# Patient Record
Sex: Male | Born: 1935 | Race: White | Hispanic: No | Marital: Single | Smoking: Never smoker
Health system: Southern US, Community
[De-identification: ages and names within clinical notes are randomized; demographics above are authoritative.]

## PROBLEM LIST (undated history)

## (undated) DIAGNOSIS — I10 Essential (primary) hypertension: Secondary | ICD-10-CM

## (undated) DIAGNOSIS — E119 Type 2 diabetes mellitus without complications: Secondary | ICD-10-CM

## (undated) DIAGNOSIS — J441 Chronic obstructive pulmonary disease with (acute) exacerbation: Secondary | ICD-10-CM

## (undated) DIAGNOSIS — F1020 Alcohol dependence, uncomplicated: Secondary | ICD-10-CM

## (undated) DIAGNOSIS — K219 Gastro-esophageal reflux disease without esophagitis: Secondary | ICD-10-CM

## (undated) DIAGNOSIS — I208 Other forms of angina pectoris: Secondary | ICD-10-CM

## (undated) DIAGNOSIS — I5023 Acute on chronic systolic (congestive) heart failure: Secondary | ICD-10-CM

## (undated) DIAGNOSIS — I214 Non-ST elevation (NSTEMI) myocardial infarction: Secondary | ICD-10-CM

## (undated) DIAGNOSIS — I2510 Atherosclerotic heart disease of native coronary artery without angina pectoris: Secondary | ICD-10-CM

## (undated) DIAGNOSIS — J9600 Acute respiratory failure, unspecified whether with hypoxia or hypercapnia: Secondary | ICD-10-CM

## (undated) DIAGNOSIS — IMO0001 Reserved for inherently not codable concepts without codable children: Secondary | ICD-10-CM

## (undated) DIAGNOSIS — F411 Generalized anxiety disorder: Secondary | ICD-10-CM

## (undated) DIAGNOSIS — I509 Heart failure, unspecified: Secondary | ICD-10-CM

## (undated) HISTORY — DX: Type 2 diabetes mellitus without complications: E11.9

## (undated) HISTORY — PX: CORONARY STENT PLACEMENT: SHX1402

## (undated) HISTORY — DX: Acute on chronic systolic (congestive) heart failure: I50.23

## (undated) HISTORY — DX: Reserved for inherently not codable concepts without codable children: IMO0001

## (undated) HISTORY — DX: Atherosclerotic heart disease of native coronary artery without angina pectoris: I25.10

## (undated) HISTORY — DX: Chronic obstructive pulmonary disease with (acute) exacerbation: J44.1

## (undated) HISTORY — DX: Heart failure, unspecified: I50.9

## (undated) HISTORY — DX: Other forms of angina pectoris: I20.8

## (undated) HISTORY — PX: HEMORROIDECTOMY: SUR656

## (undated) HISTORY — DX: Gastro-esophageal reflux disease without esophagitis: K21.9

## (undated) HISTORY — DX: Generalized anxiety disorder: F41.1

## (undated) HISTORY — DX: Alcohol dependence, uncomplicated: F10.20

## (undated) HISTORY — DX: Non-ST elevation (NSTEMI) myocardial infarction: I21.4

## (undated) HISTORY — DX: Acute respiratory failure, unspecified whether with hypoxia or hypercapnia: J96.00

---

## 2010-01-27 ENCOUNTER — Ambulatory Visit: Payer: Self-pay | Admitting: Cardiology

## 2010-01-27 ENCOUNTER — Encounter: Payer: Self-pay | Admitting: Cardiology

## 2010-02-02 ENCOUNTER — Encounter: Payer: Self-pay | Admitting: Cardiology

## 2010-02-09 ENCOUNTER — Encounter: Payer: Self-pay | Admitting: Cardiology

## 2010-02-10 ENCOUNTER — Encounter: Payer: Self-pay | Admitting: Cardiology

## 2010-02-13 ENCOUNTER — Encounter: Payer: Self-pay | Admitting: Cardiology

## 2010-02-24 ENCOUNTER — Encounter: Payer: Self-pay | Admitting: Cardiology

## 2010-02-27 ENCOUNTER — Ambulatory Visit: Payer: Self-pay | Admitting: Cardiology

## 2010-02-27 DIAGNOSIS — E785 Hyperlipidemia, unspecified: Secondary | ICD-10-CM

## 2010-02-27 DIAGNOSIS — N189 Chronic kidney disease, unspecified: Secondary | ICD-10-CM | POA: Insufficient documentation

## 2010-02-27 DIAGNOSIS — E875 Hyperkalemia: Secondary | ICD-10-CM

## 2010-02-27 DIAGNOSIS — I252 Old myocardial infarction: Secondary | ICD-10-CM

## 2010-02-27 DIAGNOSIS — F411 Generalized anxiety disorder: Secondary | ICD-10-CM | POA: Insufficient documentation

## 2010-02-27 DIAGNOSIS — K449 Diaphragmatic hernia without obstruction or gangrene: Secondary | ICD-10-CM | POA: Insufficient documentation

## 2010-02-27 DIAGNOSIS — I5022 Chronic systolic (congestive) heart failure: Secondary | ICD-10-CM

## 2010-02-27 DIAGNOSIS — I129 Hypertensive chronic kidney disease with stage 1 through stage 4 chronic kidney disease, or unspecified chronic kidney disease: Secondary | ICD-10-CM

## 2010-02-27 DIAGNOSIS — I2510 Atherosclerotic heart disease of native coronary artery without angina pectoris: Secondary | ICD-10-CM

## 2010-02-27 DIAGNOSIS — K219 Gastro-esophageal reflux disease without esophagitis: Secondary | ICD-10-CM

## 2010-02-27 DIAGNOSIS — R0602 Shortness of breath: Secondary | ICD-10-CM

## 2010-02-27 DIAGNOSIS — N179 Acute kidney failure, unspecified: Secondary | ICD-10-CM

## 2010-07-28 ENCOUNTER — Ambulatory Visit: Payer: Self-pay | Admitting: Cardiology

## 2010-09-03 ENCOUNTER — Ambulatory Visit: Payer: Self-pay | Admitting: Physician Assistant

## 2010-09-03 DIAGNOSIS — I255 Ischemic cardiomyopathy: Secondary | ICD-10-CM

## 2010-10-14 ENCOUNTER — Encounter: Payer: Self-pay | Admitting: Physician Assistant

## 2010-10-16 ENCOUNTER — Encounter: Payer: Self-pay | Admitting: Cardiology

## 2010-10-27 ENCOUNTER — Telehealth (INDEPENDENT_AMBULATORY_CARE_PROVIDER_SITE_OTHER): Payer: Self-pay | Admitting: *Deleted

## 2010-12-28 ENCOUNTER — Ambulatory Visit
Admission: RE | Admit: 2010-12-28 | Discharge: 2010-12-28 | Payer: Self-pay | Source: Home / Self Care | Attending: Cardiology | Admitting: Cardiology

## 2010-12-28 ENCOUNTER — Encounter: Payer: Self-pay | Admitting: Cardiology

## 2010-12-28 DIAGNOSIS — I10 Essential (primary) hypertension: Secondary | ICD-10-CM | POA: Insufficient documentation

## 2011-01-12 NOTE — Letter (Signed)
Summary: [facility] D/C DR. HASANAJ  [facility] D/C DR. HASANAJ   Imported By: Zachary George 02/27/2010 10:21:37  _____________________________________________________________________  External Attachment:    Type:   Image     Comment:   External Document

## 2011-01-12 NOTE — Progress Notes (Signed)
Summary: chest pain     Phone Note Other Incoming   Caller: wife Summary of Call: c/o pt having chest pain.  Has taken 25 NTG in 10 days.  Questioned why he did not go to ED and wife states because he is a stubborn man.  States his pain is mostly after he goes outside and he breathes in cold air and usually gets better after he comes in and sits in front of the heater.  Advised her to contact PMD to be evaluated this week to rule out any lung/breathing issues.  If PMD feels this is a cardiac issue, he should contact our office to request earlier appt.  Advised her to call 911 or take to ED for evaluation if symptoms persist.  She verbalized understanding.    thanks, Gene Initial call taken by: Hoover Brunette, LPN,  October 27, 2010 2:38 PM

## 2011-01-12 NOTE — Assessment & Plan Note (Signed)
Summary: chest pain --agh   Visit Type:  Follow-up Primary Provider:  Dr. Olena Leatherwood  CC:  Cardiomyopathy.  History of Present Illness: The patient  presents for followup of his known coronary disease.  I reviewed his most recent catheterization from a few years ago. He has severe three-vessel disease but refused bypass. He is being managed medically and has had PCI. He has significant left main stenosis. He does get chest pain occasionally. He thinks this is related to pain he's been having with his knees. He is having a lot of trouble walking and with nighttime knee pain. He's been told that he can't do dissipate and physical therapy because of his cardiac problems. He has not been prescribed chronic pain therapy. He will occasionally get chewable breath at night and takes nitroglycerin for this we'll put a fan on himself. This is about one or 2 times per month. He denies any palpitations, presyncope or syncope. He ambulates with a walker or cane.  Preventive Screening-Counseling & Management  Alcohol-Tobacco     Smoking Status: never  Current Medications (verified): 1)  Alprazolam 0.5 Mg Tabs (Alprazolam) .... Take 1 Tablet By Mouth Two Times A Day 2)  Lipitor 80 Mg Tabs (Atorvastatin Calcium) .... Take 1 Tablet By Mouth Once A Day 3)  Ecotrin 325 Mg Tbec (Aspirin) .... Take 1 Tablet By Mouth Once A Day 4)  Plavix 75 Mg Tabs (Clopidogrel Bisulfate) .... Take 1 Tablet By Mouth Once A Day 5)  Isosorbide Mononitrate Cr 60 Mg Xr24h-Tab (Isosorbide Mononitrate) .... Take 1 Tablet By Mouth Once A Day (Place On File) 6)  Ranitidine Hcl 150 Mg Tabs (Ranitidine Hcl) .... Take 1 Tablet By Mouth Two Times A Day 7)  Furosemide 20 Mg Tabs (Furosemide) .... Take 1 Tablet By Mouth Once A Day 8)  Carvedilol 12.5 Mg Tabs (Carvedilol) .... Take 1 Tablet By Mouth Two Times A Day (Place On File) 9)  Nitrostat 0.4 Mg Subl (Nitroglycerin) .... Use As Directed Chest Pain Every Up To 3 Doses. If No  Relief,proceed To Ed.  Allergies (verified): 1)  ! Penicillin  Comments:  Nurse/Medical Assistant: The patient's medication bottles and allergies were reviewed with the patient and were updated in the Medication and Allergy Lists.  Past History:  Past Medical History: Reviewed history from 02/27/2010 and no changes required. ACUTE KIDNEY FAILURE UNSPECIFIED CORONARY ATHEROSCLEROSIS NATIVE CORONARY ARTERY HTN CKD UNS W/CKD STAGE I THRU STAGE IV/UNS  HYPERPOTASSEMIA) CHRONIC KIDNEY DISEASE UNSPECIFIED ANXIETY STATE, UNSPECIFIED GERD OLD MYOCARDIAL INFARCTION  HYPERLIPIDEMIA DIAPHRAGMAT HERN W/O MENTION OBSTRUCTION/GANGREN ENCOUNTER FOR LONG-TERM USE OF OTHER MEDICATIONS  HISTORY OF ALCOHOL ABUSE  Past Surgical History: Hemorrhoid surgery Cardiac stent placement  Review of Systems       As stated in the HPI and negative for all other systems.   Vital Signs:  Patient profile:   77 year old male Height:      62 inches Weight:      163 pounds O2 Sat:      98 % on Room air Pulse rate:   60 / minute BP sitting:   126 / 79  (left arm) Cuff size:   regular  Vitals Entered By: Carlye Grippe (July 28, 2010 9:30 AM)  O2 Flow:  Room air  Physical Exam  General:  unkept.  unkept.   Head:  normocephalic and atraumatic Mouth:  Edentulous. Oral mucosa normal. Neck:  Neck supple, no JVD. No masses, thyromegaly or abnormal cervical nodes. Chest Wall:  no deformities or breast masses noted Lungs:  Clear bilaterally to auscultation and percussion. Heart:  Non-displaced PMI, chest non-tender; regular rate and rhythm, S1, S2 without murmurs, rubs or gallops. Carotid upstroke normal, no bruit. Normal abdominal aortic size, no bruits. Femorals normal pulses, no bruits Abdomen:  Bowel sounds positive; abdomen soft and non-tender without masses, organomegaly, or hernias noted. No hepatosplenomegaly. Msk:  Back normal, normal gait. Muscle strength and tone normal. Pulses:   diminished R dorsalis pedis.  diminished R dorsalis pedis.   Extremities:  No clubbing or cyanosis. Neurologic:  Alert and oriented x 3. Skin:  Intact without lesions or rashes. Cervical Nodes:  no significant adenopathy Psych:  Normal affect.   EKG  Procedure date:  07/28/2010  Findings:      Sinus rhythm, rate 63, left axis deviation, right bundle branch block, old inferior infarct, lateral T wave inversions, no change from previous  Impression & Recommendations:  Problem # 1:  CORONARY ATHEROSCLEROSIS NATIVE CORONARY ARTERY (ICD-414.01)  He has severe coronary artery disease but wants to pursue medical management. Toward that end I will increase his isosorbide mononitrate to 120 mg daily. He's having some trouble swallowing this and we discussed strategies for this.  Problem # 2:  HYPERLIPIDEMIA (ICD-272.4)  He cannot swallow the Lipitor and so I will change to Crestor 40 mg daily which is a smaller pill. He should have his lipids checked in about 8 weeks.  His updated medication list for this problem includes:    Crestor 40 Mg Tabs (Rosuvastatin calcium) .Marland Kitchen... Take one tablet by mouth daily.  Problem # 3:  CHRONIC SYSTOLIC HEART FAILURE (ICD-428.22) He seems to be euvolemic. He can continue to have his meds titrated over time.  Problem # 4:  Chronic Knee Pain I will defer pain management to his primary physician. There is no cardiac contraindication to pain medications except that I would avoid nonsteroidals.  Other Orders: EKG w/ Interpretation (93000)  Patient Instructions: 1)  Increase Imdur (isosorbide) to 120mg  by mouth once daily. You may take 2 of your 60mg  tablets until gone. Then get new prescription filled for 120mg  tablets. 2)  Stop Lipitor. 3)  Start Crestor 40mg  by mouth at bedtime. 4)  Return on Thursday Sept 22, 2011 at 1pm for follow up with Gene Serpe, PA-C.  Prescriptions: CRESTOR 40 MG TABS (ROSUVASTATIN CALCIUM) Take one tablet by mouth daily.  #30 x  6   Entered by:   Cyril Loosen, RN, BSN   Authorized by:   Rollene Rotunda, MD, [facility]   Signed by:   Cyril Loosen, RN, BSN on 07/28/2010   Method used:   Electronically to        [facility] [address] (retail)       [address]       [place], [place]  [address]       Ph: [phone] or [phone]       Fax: [phone]   RxID:   [id number] ISOSORBIDE MONONITRATE CR 120 MG XR24H-TAB (ISOSORBIDE MONONITRATE) Take one tablet by mouth daily  #30 x 6   Entered by:   Cyril Loosen, RN, BSN   Authorized by:   Rollene Rotunda, MD, [facility]   Signed by:   Cyril Loosen, RN, BSN on 07/28/2010   Method used:   Electronically to        [facility] [address] (retail)       [address]       [place], [place]  [address]       Ph: [phone] or [phone]       Fax: [phone]   RxID:   [id number]  I have reviewed and approved all prescriptions at the time of this visit. Rollene Rotunda, MD, [facility]  July 28, 2010 10:15 AM

## 2011-01-12 NOTE — Letter (Signed)
Summary: [facility] D/C  DR. Lia Hopping  [facility] D/C  DR. XAJE HASANAJ   Imported By: Zachary George 02/27/2010 10:07:38  _____________________________________________________________________  External Attachment:    Type:   Image     Comment:   External Document

## 2011-01-12 NOTE — Consult Note (Signed)
Summary: [facility] INPT DR. [facility]ANAJ  [facility] INPT DR. HASANAJ   Imported By: Zachary George 02/27/2010 09:54:22  _____________________________________________________________________  External Attachment:    Type:   Image     Comment:   External Document

## 2011-01-12 NOTE — Miscellaneous (Signed)
Summary: rx- crestor  Clinical Lists Changes  Medications: Changed medication from CRESTOR 40 MG TABS (ROSUVASTATIN CALCIUM) Take one tablet by mouth daily. to CRESTOR 20 MG TABS (ROSUVASTATIN CALCIUM) Take 1 tab by mouth at bedtime - Signed Rx of CRESTOR 20 MG TABS (ROSUVASTATIN CALCIUM) Take 1 tab by mouth at bedtime;  #30 x 6;  Signed;  Entered by: Hoover Brunette, LPN;  Authorized by: Lewayne Bunting, MD, [facility];  Method used: Electronically to [facility] [address] [place], [place], [place]  [address], Ph: [phone] or [phone], Fax: [phone]    Prescriptions: CRESTOR 20 MG TABS (ROSUVASTATIN CALCIUM) Take 1 tab by mouth at bedtime  #30 x 6   Entered by:   Hoover Brunette, LPN   Authorized by:   Lewayne Bunting, MD, [facility]   Signed by:   Hoover Brunette, LPN on 41/32/4401   Method used:   Electronically to        [facility] [address] (retail)       [address]       [place], [place]  [address]       Ph: [phone] or [phone]       Fax: [phone]   RxID:   [id number]

## 2011-01-12 NOTE — Letter (Signed)
Summary: Internal Other/ PATIENT HISTORY FORM  Internal Other/ PATIENT HISTORY FORM   Imported By: Dorise Hiss 03/03/2010 11:39:47  _____________________________________________________________________  External Attachment:    Type:   Image     Comment:   External Document

## 2011-01-12 NOTE — Assessment & Plan Note (Signed)
Summary: 1 MO F/U PER 8/16 OV W/JH-GD PT-JM   Visit Type:  Follow-up Primary Provider:  Dr. Olena Leatherwood   History of Present Illness: patient presents for early scheduled office followup.  Medications were recently adjusted, per Dr. Antoine Poche, with up titration of isosorbide mononitrate, for more aggressive management of breakthrough angina. Lipitor was also substituted with Crestor, secondary to complaint of difficulty swallowing.  Patient presents today feeling much better, reporting much less angina, and having to use less sublingual nitroglycerin. His breathing is also improved, and he is more easily tolerating Crestor.  He also had recent steroid injection of his left knee, by Dr. Olena Leatherwood, resulting in complete resolution of his knee pain.  Preventive Screening-Counseling & Management  Alcohol-Tobacco     Smoking Status: never  Current Medications (verified): 1)  Alprazolam 0.5 Mg Tabs (Alprazolam) .... Take 1 Tablet By Mouth Two Times A Day 2)  Crestor 40 Mg Tabs (Rosuvastatin Calcium) .... Take One Tablet By Mouth Daily. 3)  Ecotrin 325 Mg Tbec (Aspirin) .... Take 1/2 Tab (162mg ) Daily 4)  Plavix 75 Mg Tabs (Clopidogrel Bisulfate) .... Take 1 Tablet By Mouth Once A Day 5)  Isosorbide Mononitrate Cr 120 Mg Xr24h-Tab (Isosorbide Mononitrate) .... Take One Tablet By Mouth Daily 6)  Ranitidine Hcl 150 Mg Tabs (Ranitidine Hcl) .... Take 1 Tablet By Mouth Two Times A Day 7)  Furosemide 20 Mg Tabs (Furosemide) .... Take 1 Tablet By Mouth Once A Day 8)  Carvedilol 12.5 Mg Tabs (Carvedilol) .... Take 1 Tablet By Mouth Two Times A Day (Place On File) 9)  Nitrostat 0.4 Mg Subl (Nitroglycerin) .... Use As Directed Chest Pain Every Up To 3 Doses. If No Relief,proceed To Ed.  Allergies: 1)  ! Penicillin  Past History:  Past Medical History: ACUTE KIDNEY FAILURE UNSPECIFIED CORONARY ATHEROSCLEROSIS NATIVE CORONARY ARTERY HTN CKD UNS W/CKD STAGE I THRU STAGE IV/UNS   HYPERPOTASSEMIA) CHRONIC KIDNEY DISEASE UNSPECIFIED ANXIETY STATE, UNSPECIFIED GERD OLD MYOCARDIAL INFARCTION  HYPERLIPIDEMIA DIAPHRAGMAT HERN W/O MENTION OBSTRUCTION/GANGREN ENCOUNTER FOR LONG-TERM USE OF OTHER MEDICATIONS  HISTORY OF ALCOHOL ABUSE Ischemic cardiomyopathy... EF 25-30% by 2-D echo, 2/11  Review of Systems       No fevers, chills, hemoptysis, dysphagia, melena, hematocheezia, hematuria, rash, claudication, orthopnea, pnd, pedal edema. All other systems negative.   Vital Signs:  Patient profile:   77 year old male Height:      62 inches Weight:      160 pounds Pulse rate:   50 / minute BP sitting:   123 / 71  (left arm) Cuff size:   regular  Vitals Entered By: Hoover Brunette, LPN (September 03, 2010 1:07 PM) Is Patient Diabetic? No Comments 1 month f/u   Physical Exam  Additional Exam:  GEN: 77 year old male, sitting upright, in no distress HEENT: NCAT,PERRLA,EOMI NECK: palpable pulses, no bruits; no JVD; no TM LUNGS: CTA bilaterally HEART: RRR (S1S2); no significant murmurs; no rubs; no gallops ABD: soft, NT; intact BS EXT: intact distal pulses; trace peripheral edema SKIN: warm, dry MUSC: no obvious deformity NEURO: A/O (x3)     Impression & Recommendations:  Problem # 1:  CORONARY ATHEROSCLEROSIS NATIVE CORONARY ARTERY (ICD-414.01)  patient's symptoms are much improved, following recent uptitration of isosorbide mononitrate. continue current medication regimen, and schedule return clinic visit with Dr. Antoine Poche in 3 months, for continued close monitoring and further medication adjustments. Of note, may consider a repeat 2-D echo at that time, for reassessment of severe LVD. Of note, he has  previously refused surgical intervention of his severe CAD, but may be amenable to undergoing ICD implantation, if indicated. of note, patient has been on Plavix for quite a while; will down titrate aspirin to 162 mg daily.  Problem # 2:  HYPERLIPIDEMIA  (ICD-272.4)  reassess lipid status with followup FLP/LFT profile in 8 weeks, following recent substitution with Crestor.  Problem # 3:  CHRONIC SYSTOLIC HEART FAILURE (ICD-428.22)  patient has lost 3 pounds since recent visit. Continue current diuretic dose.  Other Orders: Future Orders: T-Lipid Profile ([id number]) ... 10/13/2010 T-Hepatic Function [phone]) ... 10/13/2010  Patient Instructions: 1)  Decrease Aspirin to 162mg  daily 2)  Labs:  fasting lipid panel & liver function in 8 weeks 3)  Follow up in  3 months

## 2011-01-12 NOTE — Consult Note (Signed)
Summary: NEPHROLOGY CONSULT/ [facility]  NEPHROLOGY CONSULT/ [facility]   Imported By: Zachary George 02/27/2010 10:15:52  _____________________________________________________________________  External Attachment:    Type:   Image     Comment:   External Document

## 2011-01-12 NOTE — Assessment & Plan Note (Signed)
Summary: NEW PAT HOSP FU -D/C [facility] 2/21   Visit Type:  hospital f/u Primary Provider:  Hasanaj  CC:  hospital follow-up visit.  History of Present Illness: the patient is a 77 year old male is hospitalized with a non-ST elevation myocardial infarction.  The patient has a history of multivessel coronary artery disease including severe left main coronary artery disease.  He has an ejection fraction of 25 to 30%.  He has declined more recently noninvasive testing as well as intervention and coronary artery bypass grafting in the past.  He has renal insufficiency with a creatinine of around two.  He was recently hospitalized because of acute pulmonary edema with ventilator-dependent respiratory failure in the setting of a non-ST elevation myocardial infarction.  The patient continues to complain of shortness of breath on minimal exertion.  He denies however any recent chest pain.  He reports a cough of productive white phlegm.  He complains of a dry mouth.  He denies any palpitations or syncope.  The patient continues to decline any further testing or intervention.  Clinical Review Panels:  CXR CXR results  PORTABLE CHEST - 1 VIEW                             Comparison: 01/28/2010                   Findings: Interval extubation.  No focal opacities in the lungs.         No effusions.  Heart is borderline in size.                   IMPRESSION:         Interval extubation.  No acute cardiopulmonary disease.           (02/09/2010)  Echocardiogram Echocardiogram Transthoracic Echocardiogram            Conclusions:         1. The estimated ejection fraction is 25-30%.          2. There is severely decreased left ventricular systolic function.         3. There are multiple regional wall motion abnormalities.          4. The left atrial chamber size is normal.         5. The right ventricular cavity size is normal.         6. There is no evidence of aortic regurgitation.         7. There is  no evidence of mitral regurgitation.         8. There is no evidence of tricuspid valve regurgitation.         9. The pericardium appears normal.         10. The inferior vena cava appears normal.         GUY NMN DEGENT, MD      (01/27/2010)    Preventive Screening-Counseling & Management  Alcohol-Tobacco     Smoking Status: never  Current Medications (verified): 1)  Alprazolam 0.5 Mg Tabs (Alprazolam) .... Take 1 Tablet By Mouth Two Times A Day 2)  Lipitor 80 Mg Tabs (Atorvastatin Calcium) .... Take 1 Tablet By Mouth Once A Day 3)  Ecotrin 325 Mg Tbec (Aspirin) .... Take 1 Tablet By Mouth Once A Day 4)  Plavix 75 Mg Tabs (Clopidogrel Bisulfate) .... Take 1 Tablet By Mouth Once A Day 5)  Isosorbide Mononitrate  Cr 60 Mg Xr24h-Tab (Isosorbide Mononitrate) .... Take 1 Tablet By Mouth Once A Day (Place On File) 6)  Ranitidine Hcl 150 Mg Tabs (Ranitidine Hcl) .... Take 1 Tablet By Mouth Two Times A Day 7)  Furosemide 20 Mg Tabs (Furosemide) .... Take 1 Tablet By Mouth Once A Day 8)  Carvedilol 12.5 Mg Tabs (Carvedilol) .... Take 1 Tablet By Mouth Two Times A Day (Place On File)  Allergies (verified): 1)  ! Penicillin  Comments:  Nurse/Medical Assistant: The patient's medications and allergies were reviewed with the patient and were updated in the Medication and Allergy Lists. List and bottles reviewed.  Past History:  Past Medical History: Last updated: 02/27/2010 ACUTE KIDNEY FAILURE UNSPECIFIED CORONARY ATHEROSCLEROSIS NATIVE CORONARY ARTERY HTN CKD UNS W/CKD STAGE I THRU STAGE IV/UNS  HYPERPOTASSEMIA) CHRONIC KIDNEY DISEASE UNSPECIFIED ANXIETY STATE, UNSPECIFIED GERD OLD MYOCARDIAL INFARCTION  HYPERLIPIDEMIA DIAPHRAGMAT HERN W/O MENTION OBSTRUCTION/GANGREN ENCOUNTER FOR LONG-TERM USE OF OTHER MEDICATIONS  HISTORY OF ALCOHOL ABUSE  Past Surgical History: Last updated: 02/27/2010 Hemorrhoid surgery cardiac stent placement  Social History: Last updated:  02/27/2010 Alcohol Use - no Regular Exercise - no History of smoking  Risk Factors: Smoking Status: never (02/27/2010)  Social History: Smoking Status:  never  Review of Systems       The patient complains of shortness of breath.  The patient denies fatigue, malaise, fever, weight gain/loss, vision loss, decreased hearing, hoarseness, chest pain, palpitations, prolonged cough, wheezing, sleep apnea, coughing up blood, abdominal pain, blood in stool, nausea, vomiting, diarrhea, heartburn, incontinence, blood in urine, muscle weakness, joint pain, leg swelling, rash, skin lesions, headache, fainting, dizziness, depression, anxiety, enlarged lymph nodes, easy bruising or bleeding, and environmental allergies.         dry mouth,  Vital Signs:  Patient profile:   77 year old male Height:      62 inches Weight:      150 pounds BMI:     27.53 Pulse rate:   91 / minute BP sitting:   119 / 85  (left arm) Cuff size:   regular  Vitals Entered By: Carlye Grippe (February 27, 2010 11:06 AM)  Nutrition Counseling: Patient's BMI is greater than 25 and therefore counseled on weight management options. CC: hospital follow-up visit   Physical Exam  Additional Exam:  General: Well-developed, well-nourished in no distress head: Normocephalic and atraumatic eyes PERRLA/EOMI intact, conjunctiva and lids normal nose: No deformity or lesions mouth normal dentition, normal posterior pharynx neck: Supple, no JVD.  No masses, thyromegaly or abnormal cervical nodes lungs: Normal breath sounds bilaterally without wheezing.  Normal percussion heart: regular rate and rhythm with normal S1 and S2, no S3 or S4.  PMI is normal.  No pathological murmurs abdomen: Normal bowel sounds, abdomen is soft and nontender without masses, organomegaly or hernias noted.  No hepatosplenomegaly musculoskeletal: Back normal, normal gait muscle strength and tone normal pulsus: Pulse is normal in all 4  extremities Extremities: No peripheral pitting edema neurologic: Alert and oriented x 3 skin: Intact without lesions or rashes cervical nodes: No significant adenopathy psychologic: Normal affect    Impression & Recommendations:  Problem # 1:  CHRONIC SYSTOLIC HEART FAILURE (ICD-428.22) the patient has a severe ischemic cardiomyopathy.  He status post recentpulmonary edema.  Is also status-post recent non-ST elevation myocardial infarction.  He continues to decline any further intervention.  We increased the patient's carvedilol to 12.5 mg p.o. b.i.d. and increase Imdur to 60 mg p.o. daily. His updated medication list for  this problem includes:    Ecotrin 325 Mg Tbec (Aspirin) .Marland Kitchen... Take 1 tablet by mouth once a day    Plavix 75 Mg Tabs (Clopidogrel bisulfate) .Marland Kitchen... Take 1 tablet by mouth once a day    Isosorbide Mononitrate Cr 60 Mg Xr24h-tab (Isosorbide mononitrate) .Marland Kitchen... Take 1 tablet by mouth once a day (place on file)    Furosemide 20 Mg Tabs (Furosemide) .Marland Kitchen... Take 1 tablet by mouth once a day    Carvedilol 12.5 Mg Tabs (Carvedilol) .Marland Kitchen... Take 1 tablet by mouth two times a day (place on file)  Orders: T-CBC No Diff ([id number]) T-Comprehensive Metabolic Panel ([id number]) T-BNP  (B Natriuretic Peptide) (40347-42595) T-Lipid Profile ([id number])  Problem # 2:  ACUTE KIDNEY FAILURE UNSPECIFIED (ICD-584.9) the patient is followed by Dr. Kristian Covey  Problem # 3:  SHORTNESS OF BREATH (ICD-786.05) the patient's shortness of breath is related to his underlying severe ischemic cardiac myopathy.  However blood work was drawn and we will check a BNP level.  I made no change in the patient's Lasix dosing for now His updated medication list for this problem includes:    Ecotrin 325 Mg Tbec (Aspirin) .Marland Kitchen... Take 1 tablet by mouth once a day    Furosemide 20 Mg Tabs (Furosemide) .Marland Kitchen... Take 1 tablet by mouth once a day    Carvedilol 12.5 Mg Tabs (Carvedilol) .Marland Kitchen... Take 1 tablet by mouth two  times a day (place on file)  Orders: T-CBC No Diff ([id number]) T-Comprehensive Metabolic Panel ([id number]) T-BNP  (B Natriuretic Peptide) (23557-32202) T-Lipid Profile (54270-62376)  Patient Instructions: 1)  Labs today 2)  Increase Carvedilol to 12.5mg  two times a day  3)  Increase Imdur to 60mg  daily 4)  Follow up in  6 months Prescriptions: ISOSORBIDE MONONITRATE CR 60 MG XR24H-TAB (ISOSORBIDE MONONITRATE) Take 1 tablet by mouth once a day (PLACE ON FILE)  #30 x 6   Entered by:   Hoover Brunette, LPN   Authorized by:   Lewayne Bunting, MD, [facility]   Signed by:   Hoover Brunette, LPN on 28/31/5176   Method used:   Electronically to        [facility] [address] (retail)       [address]       [place], [place]  [address]       Ph: [phone] or [phone]       Fax: [phone]   RxID:   [id number] CARVEDILOL 12.5 MG TABS (CARVEDILOL) Take 1 tablet by mouth two times a day (PLACE ON FILE)  #60 x 6   Entered by:   Hoover Brunette, LPN   Authorized by:   Lewayne Bunting, MD, [facility]   Signed by:   Hoover Brunette, LPN on 58/52/7782   Method used:   Electronically to        [facility] [address] (retail)       [address]       [place], [place]  [address]       Ph: [phone] or [phone]       Fax: [phone]   RxID:   [id number]

## 2011-01-14 NOTE — Assessment & Plan Note (Signed)
Summary: 6 MO FU PER DEC REMINDER   Visit Type:  Follow-up Primary Provider:  Dr. Olena Leatherwood  CC:  CAD.  History of Present Illness: The patient presents for routine followup. Since I last saw him he has had no new cardiovascular complaints. He will get chest pain he goes out into the cold air and he will take a nitroglycerin. However, he thinks this is much improved compared to previous. He is taking many fewer nitroglycerin that he used to take. He denies any acute shortness of breath, PND or orthopnea. He is not having any palpitations, presyncope or syncope. He said no weight gain or edema.  Preventive Screening-Counseling & Management  Alcohol-Tobacco     Smoking Status: never  Current Medications (verified): 1)  Alprazolam 0.5 Mg Tabs (Alprazolam) .... Take 1 Tablet By Mouth Two Times A Day 2)  Crestor 20 Mg Tabs (Rosuvastatin Calcium) .... Take 1 Tab By Mouth At Bedtime 3)  Ecotrin 325 Mg Tbec (Aspirin) .... Take 1/2 Tab (162mg ) Daily 4)  Plavix 75 Mg Tabs (Clopidogrel Bisulfate) .... Take 1 Tablet By Mouth Once A Day 5)  Isosorbide Mononitrate Cr 120 Mg Xr24h-Tab (Isosorbide Mononitrate) .... Take One Tablet By Mouth Daily 6)  Ranitidine Hcl 150 Mg Tabs (Ranitidine Hcl) .... Take 1 Tablet By Mouth Two Times A Day 7)  Furosemide 20 Mg Tabs (Furosemide) .... Take 1 Tablet By Mouth Once A Day 8)  Carvedilol 12.5 Mg Tabs (Carvedilol) .... Take 1 Tablet By Mouth Two Times A Day (Place On File) 9)  Nitrostat 0.4 Mg Subl (Nitroglycerin) .... Use As Directed Chest Pain Every Up To 3 Doses. If No Relief,proceed To Ed.  Allergies (verified): 1)  ! Penicillin  Comments:  Nurse/Medical Assistant: The patient's medications and allergies were reviewed with the patient and were updated in the Medication and Allergy Lists. PT COULD NOT VERIFY LIST.  WILL BRING A LIST TOMORROW 12/29/10 Tammi Romine CMA (December 28, 2010 3:07 PM)  Past History:  Past Medical History: Reviewed history  from 09/03/2010 and no changes required. ACUTE KIDNEY FAILURE UNSPECIFIED CORONARY ATHEROSCLEROSIS NATIVE CORONARY ARTERY HTN CKD UNS W/CKD STAGE I THRU STAGE IV/UNS  HYPERPOTASSEMIA) CHRONIC KIDNEY DISEASE UNSPECIFIED ANXIETY STATE, UNSPECIFIED GERD OLD MYOCARDIAL INFARCTION  HYPERLIPIDEMIA DIAPHRAGMAT HERN W/O MENTION OBSTRUCTION/GANGREN ENCOUNTER FOR LONG-TERM USE OF OTHER MEDICATIONS  HISTORY OF ALCOHOL ABUSE Ischemic cardiomyopathy... EF 25-30% by 2-D echo, 2/11  Past Surgical History: Reviewed history from 07/28/2010 and no changes required. Hemorrhoid surgery Cardiac stent placement  Review of Systems       As stated in the HPI and negative for all other systems.   Vital Signs:  Patient profile:   77 year old male Height:      62 inches Weight:      168 pounds BMI:     30.84 Pulse rate:   60 / minute BP sitting:   151 / 75  (left arm) Cuff size:   regular  Vitals Entered By: Fuller Plan CMA (December 28, 2010 3:08 PM)  Physical Exam  General:  Well developed, well nourished, in no acute distress. Head:  normocephalic and atraumatic Neck:  Neck supple, no JVD. No masses, thyromegaly or abnormal cervical nodes. Chest Wall:  no deformities or breast masses noted Lungs:  Clear bilaterally to auscultation and percussion. Abdomen:  Bowel sounds positive; abdomen soft and non-tender without masses, organomegaly, or hernias noted. No hepatosplenomegaly. Msk:  Back normal, normal gait. Muscle strength and tone normal. Extremities:  No clubbing  or cyanosis. Neurologic:  Alert and oriented x 3. Skin:  Intact without lesions or rashes. Cervical Nodes:  no significant adenopathy Inguinal Nodes:  no significant adenopathy Psych:  Normal affect.   Detailed Cardiovascular Exam  Neck    Carotids: Carotids full and equal bilaterally without bruits.      Neck Veins: Normal, no JVD.    Heart    Inspection: no deformities or lifts noted.      Palpation: normal PMI with  no thrills palpable.      Auscultation: regular rate and rhythm, S1, S2 without murmurs, rubs, gallops, or clicks.    Vascular    Abdominal Aorta: no palpable masses, pulsations, or audible bruits.      Femoral Pulses: normal femoral pulses bilaterally.      Radial Pulses: normal radial pulses bilaterally.      Peripheral Circulation: no clubbing, cyanosis, or edema noted with normal capillary refill.     EKG  Procedure date:  12/28/2010  Findings:      sinus bradycardia, rate 59, premature ventricular contractions, old inferior infarct, right bundle branch block, probable lateral infarct, no acute ST-T wave changes  Impression & Recommendations:  Problem # 1:  CORONARY ATHEROSCLEROSIS NATIVE CORONARY ARTERY (ICD-414.01) The patient has severe coronary disease. However, he has steadfastly refused bypass. PCI was not an option at the last catheterization. He understands his risk of sudden death he says he is "okay with this". He says he is almost 77 years old and wants medical management. This means he would not want an ICD as well. He is quite pleased with the improvement he has experienced with medical management. We will continue with this.  Problem # 2:  CHRONIC SYSTOLIC HEART FAILURE (ICD-428.22) He seems to be euvolemic on the meds as listed. I cannot titrate his beta blocker further secondary to bradycardia. He will remain on meds as listed.  Problem # 3:  HYPERLIPIDEMIA (ICD-272.4) I will defer to his primary physician with a goal LDL less than 70 and HDL greater than 40.  Problem # 4:  ESSENTIAL HYPERTENSION, BENIGN (ICD-401.1) His blood pressure is slightly elevated today and previous readings and this is unusual. I will change his regimen.  Other Orders: EKG w/ Interpretation (93000)  Patient Instructions: 1)  Your physician wants you to follow-up in: 6 months. You will receive a reminder letter in the mail one-two months in advance. If you don't receive a letter, please  call our office to schedule the follow-up appointment. 2)  CALL OR BRING MEDICATIONS BY THE OFFICE.

## 2011-07-20 DIAGNOSIS — R7989 Other specified abnormal findings of blood chemistry: Secondary | ICD-10-CM

## 2011-07-22 DIAGNOSIS — I214 Non-ST elevation (NSTEMI) myocardial infarction: Secondary | ICD-10-CM

## 2011-07-23 DIAGNOSIS — I5021 Acute systolic (congestive) heart failure: Secondary | ICD-10-CM

## 2011-07-23 DIAGNOSIS — R079 Chest pain, unspecified: Secondary | ICD-10-CM

## 2011-08-11 ENCOUNTER — Encounter: Payer: Self-pay | Admitting: Cardiology

## 2011-08-12 ENCOUNTER — Encounter: Payer: Self-pay | Admitting: Cardiology

## 2011-08-12 ENCOUNTER — Ambulatory Visit (INDEPENDENT_AMBULATORY_CARE_PROVIDER_SITE_OTHER): Payer: Medicare Other | Admitting: Physician Assistant

## 2011-08-12 VITALS — BP 96/61 | HR 51 | Ht 62.0 in | Wt 147.0 lb

## 2011-08-12 DIAGNOSIS — I5022 Chronic systolic (congestive) heart failure: Secondary | ICD-10-CM

## 2011-08-12 DIAGNOSIS — E119 Type 2 diabetes mellitus without complications: Secondary | ICD-10-CM | POA: Insufficient documentation

## 2011-08-12 DIAGNOSIS — I498 Other specified cardiac arrhythmias: Secondary | ICD-10-CM

## 2011-08-12 DIAGNOSIS — I959 Hypotension, unspecified: Secondary | ICD-10-CM | POA: Insufficient documentation

## 2011-08-12 DIAGNOSIS — I2589 Other forms of chronic ischemic heart disease: Secondary | ICD-10-CM

## 2011-08-12 DIAGNOSIS — R001 Bradycardia, unspecified: Secondary | ICD-10-CM

## 2011-08-12 DIAGNOSIS — E785 Hyperlipidemia, unspecified: Secondary | ICD-10-CM

## 2011-08-12 MED ORDER — CARVEDILOL 3.125 MG PO TABS: 3.1250 mg | ORAL_TABLET | Freq: Two times a day (BID) | ORAL | Status: DC

## 2011-08-12 NOTE — Assessment & Plan Note (Signed)
Patient is euvolemic by history and PE. He has lost 7 additional pounds, since discharge from [facility]. Therefore, recommend continuing current maintenance dose Lasix. Recent f/u labs notable for BUN 27, creatinine 1.3, and potassium 4.5. Advise 1500 cc fluid restriction/24 hours.

## 2011-08-12 NOTE — Assessment & Plan Note (Signed)
Patient is doing much better, since recent hospitalization with NST EMI and A/C. SHF. He continues to have symptoms suggestive of stable angina, requiring occasional p.r.n. NTG, with relief. Of note, he denies any rest angina. He continues to decline aggressive treatment, including ICD implantation. Therefore, continued conservative management is recommended, with close clinic followup with Dr. Andee Lineman.

## 2011-08-12 NOTE — Progress Notes (Signed)
HPI: patient presents for post [facility] followuptrauma following presentation with NSTEMI and A/C SHF.  As in the past, patient declined aggressive management. He was treated initially with IV heparin, and had a troponin of 1.8. A 2-D echo was obtained, yielding EF 25-30%. He was discharged on increased dose of maintenance Lasix at 40 mg daily, with concomitant decrease in ACE inhibitor, secondary to relative hypotension. Dr. Andee Lineman also added low-dose aspirin to his Plavix, and restarted low-dose carvedilol. He also recommended high dose isosorbide mononitrate. Recent post hospital labs indicated stable CRI, with creatinine 1.3, BUN 27, and potassium 4.1. He had a BNP of 1100 on admission, down to 190 at discharge.  Clinically, he feels much better. He has occasional exertional angina, requiring p.r.n. NTG, with subsequent relief, but denies any rest angina. He is able to sleep on 2 pillows, and has no peripheral edema. He is compliant with medications, and refrains from dietary sodium indiscretion.  The patient has also experienced occasional postural dizziness, upon standing. However, he denies any frank syncope or falls.  Allergies  Allergen Reactions  . Penicillins     No current outpatient prescriptions on file prior to visit.    Past Medical History  Diagnosis Date  . Congestive heart failure, unspecified   . Acute on chronic systolic heart failure   . Acute respiratory failure   . Acute myocardial infarction, subendocardial infarction, initial episode of care   . Obstructive chronic bronchitis with exacerbation   . Other chronic pulmonary heart diseases   . Congestive heart failure, unspecified   . Coronary atherosclerosis of native coronary artery   . Anxiety state, unspecified   . Type II or unspecified type diabetes mellitus without mention of complication, not stated as uncontrolled   . Other specified forms of chronic ischemic heart disease   . Alcoholism /alcohol abuse   . GERD  (gastroesophageal reflux disease)     Past Surgical History  Procedure Date  . Hemorroidectomy   . Coronary stent placement     History   Social History  . Marital Status: Single    Spouse Name: N/A    Number of Children: N/A  . Years of Education: N/A   Occupational History  . RETIRED    Social History Main Topics  . Smoking status: Never Smoker   . Smokeless tobacco: Never Used  . Alcohol Use: No     History of Alcohol use  . Drug Use: No  . Sexually Active: Not on file   Other Topics Concern  . Not on file   Social History Narrative   No regular exercise    No family history on file.  ROS: Negative for exertional chest pain, DOE, orthopnea, PND, lower extremity edema, palpitations, presyncope/syncope, claudication, reflux, hematuria, hematochezia, or melena. Remaining systems reviewed, and are negative.   PHYSICAL EXAM:  BP 107/59  Pulse 50  Ht 5\' 2"  (1.575 m)  Wt 147 lb (66.679 kg)  BMI 26.89 kg/m2  GENERAL: well-nourished, well-developed; NAD HEENT: NCAT, PERRLA, EOMI; sclera clear; no xanthelasma NECK: palpable bilateral carotid pulses, no bruits; no JVD; no TM LUNGS: CTA bilaterally CARDIAC: RRR (S1, S2); no significant murmurs; no rubs or gallops ABDOMEN: soft, non-tender; intact BS EXTREMETIES: intact distal pulses; no significant peripheral edema SKIN: warm/dry; no obvious rash/lesions MUSCULOSKELETAL: no joint deformity NEURO: no focal deficit; NL affect    EKG: refer to EKG section    ASSESSMENT & PLAN:

## 2011-08-12 NOTE — Assessment & Plan Note (Signed)
Will decrease carvedilol to 3.125 mg b.i.d., in light of marked bradycardia. He currently has demonstrated heart rates in the 50-55 range. He is not on any other rate controlling agents.

## 2011-08-12 NOTE — Assessment & Plan Note (Addendum)
Aggressive management recommended with LDL target of 70 or less, if feasible. Continue current dose Crestor.

## 2011-08-12 NOTE — Patient Instructions (Addendum)
   Fluid restriction - total 1,500cc/24 hours  Decrease Coreg to 3.125mg  twice a day Follow up in  1 month

## 2011-08-12 NOTE — Assessment & Plan Note (Signed)
Followed by Dr. Hasanaj. 

## 2011-08-12 NOTE — Assessment & Plan Note (Addendum)
No significant drop in SBP by orthostatic BP measurement. His systolic remained in the 90-100 range. Therefore, we'll continue current doses of anti-hypertensives. However, will decrease Coreg (see Bradycardia), as outlined.

## 2011-08-23 ENCOUNTER — Telehealth: Payer: Self-pay | Admitting: *Deleted

## 2011-08-23 NOTE — Telephone Encounter (Signed)
Last seen 8/30.  Decreased his Carvedilol to 3.125mg  twice a day.  States since making change, having a lot more chest pain.  States a bottle of Nitro usually last 30 days.  Only has 2 tabs left, has used whole bottle in 2 weeks.  Advised to go to ED for continued chest pain, states that they can't do anything for him anymore.  Again, encouraged patient to go to ED for worsening chest pain.  Will send note to provider for any suggestions.  Has pending OV for 10/8.

## 2011-08-25 NOTE — Telephone Encounter (Signed)
Recommend increasing Imdur to 120 mg daily, renew prescription for NTG, and bring in to office sooner, if sxs worsen.

## 2011-08-30 NOTE — Telephone Encounter (Signed)
Continue current medication regimen, as you've outlined. Will reassess at time of next OV.

## 2011-08-30 NOTE — Telephone Encounter (Signed)
Left message to return call 

## 2011-08-30 NOTE — Telephone Encounter (Signed)
Patient notified of below.  Stated he was suppose to be taking 3 per day (Isosorbide 60mg ) per Hasanaj.  Most days he only takes the 2 tabs which would be the 120mg  daily.  Did get his NTG refilled.  Please advise if you want him to go back up to 3 per day.  Doing okay right now.  Has OV on 10/8

## 2011-09-20 ENCOUNTER — Ambulatory Visit (INDEPENDENT_AMBULATORY_CARE_PROVIDER_SITE_OTHER): Payer: Medicare Other | Admitting: Cardiology

## 2011-09-20 ENCOUNTER — Encounter: Payer: Self-pay | Admitting: Cardiology

## 2011-09-20 DIAGNOSIS — I2510 Atherosclerotic heart disease of native coronary artery without angina pectoris: Secondary | ICD-10-CM

## 2011-09-20 DIAGNOSIS — I959 Hypotension, unspecified: Secondary | ICD-10-CM

## 2011-09-20 DIAGNOSIS — I5022 Chronic systolic (congestive) heart failure: Secondary | ICD-10-CM

## 2011-09-20 MED ORDER — CARVEDILOL 3.125 MG PO TABS: 3.1250 mg | ORAL_TABLET | Freq: Two times a day (BID) | ORAL | Status: DC

## 2011-09-20 MED ORDER — ISOSORBIDE MONONITRATE ER 120 MG PO TB24: ORAL_TABLET | ORAL | Status: DC

## 2011-09-20 NOTE — Assessment & Plan Note (Signed)
Euvolemic by history and PE. Increase in weight is attributed to improve appetite. Patient continues to refrain from added salt in his diet.

## 2011-09-20 NOTE — Patient Instructions (Signed)
   Increase Imdur to 180mg  daily  Decrease Coreg to 3.125mg  twice a day Your physician wants you to follow up in:  3 months.  You will receive a reminder letter in the mail one-two months in advance.  If you don't receive a letter, please call our office to schedule the follow up appointment

## 2011-09-20 NOTE — Progress Notes (Signed)
Addended by: Murriel Hopper on: 09/20/2011 03:00 PM   Modules accepted: Orders

## 2011-09-20 NOTE — Assessment & Plan Note (Signed)
BP stable on current medication regimen.

## 2011-09-20 NOTE — Assessment & Plan Note (Signed)
Symptoms consistent with chronic stable angina pectoris. However, we'll continue to try to optimize medication regimen, for better suppression of breakthrough CP. Will, once again, decrease carvedilol to 3.125 twice a day, with concomitant rise in Imdur to 180 mg every morning. As before, I am trying to increase his basal heart rate from the low 50 bpm range to improve his cardiac output and, hopefully, associated anginal symptoms.

## 2011-09-20 NOTE — Progress Notes (Signed)
HPI:  Patient returns for scheduled visit.  When last seen, medications were adjusted with down titration of carvedilol to 3.125 twice a day, secondary to hypotension. He was also placed on 1500 cc fluid restriction, for management of chronic SH F. He subsequently called our office, complaining of increasing CP, and we increased Imdur to 120 mg daily, and renewed prescription for prn NTG. As in the past, patient has declined aggressive management, for his severe ischemic cardio myopathy (EF 25-30%). This is his second visit here in the clinic, following recent hospitalization, here at [facility], with NST EMI and A/C SHF.  Patient presents with symptoms essentially consistent with chronic stable angina pectoris. He also denies any symptoms suggestive of decompensated heart failure. He continues to have some postural dizziness with immediate standing, but no fall/syncope. He continues to remain quite active, walking freely, with minimal symptoms with a slow pace.  He continues to decline aggressive management of his ischemic cardiomyopathy.   Allergies  Allergen Reactions  . Penicillins     Current Outpatient Prescriptions on File Prior to Visit  Medication Sig Dispense Refill  . ALPRAZolam (XANAX) 0.5 MG tablet Take 0.5 mg by mouth 2 (two) times daily.        Marland Kitchen aspirin 81 MG tablet Take 162 mg by mouth daily.       . carvedilol (COREG) 3.125 MG tablet Take 1 tablet (3.125 mg total) by mouth 2 (two) times daily with a meal.  60 tablet  6  . clopidogrel (PLAVIX) 75 MG tablet Take 75 mg by mouth daily.        . fish oil-omega-3 fatty acids 1000 MG capsule Take 1 g by mouth daily.        . furosemide (LASIX) 40 MG tablet Take 40 mg by mouth daily.        Marland Kitchen HYDROcodone-acetaminophen (VICODIN) 5-500 MG per tablet Take 1 tablet by mouth 2 (two) times daily.        . isosorbide mononitrate (IMDUR) 60 MG 24 hr tablet Take 180 mg by mouth daily.        . nitroGLYCERIN (NITROSTAT) 0.4 MG SL tablet Place 0.4 mg  under the tongue every 5 (five) minutes as needed. May use for up to 3 doses.       . ramipril (ALTACE) 2.5 MG capsule Take 2.5 mg by mouth daily.        . ranitidine (ZANTAC) 150 MG tablet Take 150 mg by mouth 2 (two) times daily.        . rosuvastatin (CRESTOR) 20 MG tablet Take 20 mg by mouth at bedtime.          Past Medical History  Diagnosis Date  . Congestive heart failure, unspecified   . Acute on chronic systolic heart failure   . Acute respiratory failure   . Acute myocardial infarction, subendocardial infarction, initial episode of care   . Obstructive chronic bronchitis with exacerbation   . Other chronic pulmonary heart diseases   . Congestive heart failure, unspecified   . Coronary atherosclerosis of native coronary artery   . Anxiety state, unspecified   . Type II or unspecified type diabetes mellitus without mention of complication, not stated as uncontrolled   . Other specified forms of chronic ischemic heart disease   . Alcoholism /alcohol abuse   . GERD (gastroesophageal reflux disease)     Past Surgical History  Procedure Date  . Hemorroidectomy   . Coronary stent placement     History  Social History  . Marital Status: Single    Spouse Name: N/A    Number of Children: N/A  . Years of Education: N/A   Occupational History  . RETIRED    Social History Main Topics  . Smoking status: Never Smoker   . Smokeless tobacco: Never Used  . Alcohol Use: No     History of Alcohol use  . Drug Use: No  . Sexually Active: Not on file   Other Topics Concern  . Not on file   Social History Narrative   No regular exercise    No family history on file.  ROS:  Negative for exertional chest pain, DOE, orthopnea, PND, lower extremity edema, palpitations, presyncope/syncope, claudication, reflux, hematuria, hematochezia, or melena. Remaining systems reviewed, and are negative.   PHYSICAL EXAM:  There were no vitals taken for this visit.  GENERAL:  well-nourished, well-developed; NAD  HEENT: NCAT, PERRLA, EOMI; sclera clear; no xanthelasma  NECK: palpable bilateral carotid pulses, no bruits; no JVD; no TM  LUNGS: CTA bilaterally  CARDIAC: RRR (S1, S2); no significant murmurs; no rubs or gallops  ABDOMEN: soft, non-tender; intact BS  EXTREMETIES: intact distal pulses; no significant peripheral edema  SKIN: warm/dry; no obvious rash/lesions  MUSCULOSKELETAL: no joint deformity  NEURO: no focal deficit; NL affect   EKG:    ASSESSMENT & PLAN:

## 2011-09-21 NOTE — Progress Notes (Signed)
Note reviewed.

## 2011-10-02 ENCOUNTER — Inpatient Hospital Stay (HOSPITAL_COMMUNITY)
Admission: AD | Admit: 2011-10-02 | Payer: Self-pay | Source: Other Acute Inpatient Hospital | Admitting: Cardiovascular Disease

## 2011-10-03 DIAGNOSIS — I200 Unstable angina: Secondary | ICD-10-CM

## 2011-10-04 DIAGNOSIS — I219 Acute myocardial infarction, unspecified: Secondary | ICD-10-CM

## 2011-10-07 ENCOUNTER — Encounter: Payer: Self-pay | Admitting: Cardiology

## 2011-10-27 ENCOUNTER — Encounter: Payer: Self-pay | Admitting: Cardiology

## 2011-10-27 ENCOUNTER — Ambulatory Visit (INDEPENDENT_AMBULATORY_CARE_PROVIDER_SITE_OTHER): Payer: Medicare Other | Admitting: Cardiology

## 2011-10-27 DIAGNOSIS — N189 Chronic kidney disease, unspecified: Secondary | ICD-10-CM

## 2011-10-27 DIAGNOSIS — I2589 Other forms of chronic ischemic heart disease: Secondary | ICD-10-CM

## 2011-10-27 DIAGNOSIS — I959 Hypotension, unspecified: Secondary | ICD-10-CM

## 2011-10-27 DIAGNOSIS — I5022 Chronic systolic (congestive) heart failure: Secondary | ICD-10-CM

## 2011-10-27 NOTE — Assessment & Plan Note (Signed)
No significant recurrent CP, since recent hospitalization for NST EMI, treated medically (patient declined aggressive treatment). Patient has a recent new prescription for NTG. We'll continue to monitor closely, and schedule early return OV in 2 months.

## 2011-10-27 NOTE — Progress Notes (Signed)
cc:  HPI: Patient presents for post hospital followup.  Since his recent hospitalization with NST EMI, treated medically (patient declined aggressive treatment), patient has been doing "real good". In fact, he is having only occasional angina, and none of the symptoms which prompted his recent hospitalization. He also denies symptoms suggestive of decompensated heart failure. He denies presyncope/syncope or falls, in light of noted hypotension and sinus bradycardia.  Recent post hospital labs, October 21: Potassium 4.0, BUN 26, and creatinine 1.6.  PMH: reviewed and listed in Problem List in electronic Records (and see below)  Allergies/SH/FH: available in Electronic Records for review  Current Outpatient Prescriptions  Medication Sig Dispense Refill  . ALPRAZolam (XANAX) 0.5 MG tablet Take 0.5 mg by mouth 2 (two) times daily.        Marland Kitchen aspirin 81 MG tablet Take 162 mg by mouth daily.       . carvedilol (COREG) 12.5 MG tablet Take 12.5 mg by mouth 2 (two) times daily with a meal.        . clopidogrel (PLAVIX) 75 MG tablet Take 75 mg by mouth daily.        . cyclobenzaprine (FLEXERIL) 10 MG tablet Take 10 mg by mouth at bedtime.        . fish oil-omega-3 fatty acids 1000 MG capsule Take 1 g by mouth 3 (three) times daily.       . furosemide (LASIX) 40 MG tablet Take 40 mg by mouth daily.        Marland Kitchen HYDROcodone-acetaminophen (VICODIN) 5-500 MG per tablet Take 1 tablet by mouth 2 (two) times daily.        . isosorbide mononitrate (IMDUR) 120 MG 24 hr tablet Take 1 1/2 tabs (180mg ) daily  45 tablet  6  . nitroGLYCERIN (NITROSTAT) 0.4 MG SL tablet Place 0.4 mg under the tongue every 5 (five) minutes as needed. May use for up to 3 doses.       . ramipril (ALTACE) 2.5 MG capsule Take 2.5 mg by mouth daily.        . ranitidine (ZANTAC) 150 MG tablet Take 150 mg by mouth 2 (two) times daily.        . rosuvastatin (CRESTOR) 20 MG tablet Take 20 mg by mouth at bedtime.          ROS: no nausea,  vomiting; no fever, chills; no melena, hematochezia; no claudication  PHYSICAL EXAM:  BP 107/68  Pulse 53  Ht 5\' 2"  (1.575 m)  Wt 156 lb (70.761 kg)  BMI 28.53 kg/m2  GENERAL: well-nourished, well-developed; NAD HEENT: NCAT, PERRLA, EOMI; sclera clear; no xanthelasma NECK: palpable bilateral carotid pulses, no bruits; no JVD; no TM LUNGS: CTA bilaterally CARDIAC: RRR (S1, S2); no significant murmurs; no rubs or gallops ABDOMEN: soft, non-tender; intact BS EXTREMETIES: intact distal pulses; no significant peripheral edema SKIN: warm/dry; no obvious rash/lesions MUSCULOSKELETAL: no joint deformity NEURO: no focal deficit; NL affect   EKG:     ASSESSMENT & PLAN:

## 2011-10-27 NOTE — Assessment & Plan Note (Signed)
Euvolemic by history and PE. Will write a prescription for a bathroom scale, and patient was instructed to try to weigh himself daily, if at all possible. Dietary sodium restriction was also advised.

## 2011-10-27 NOTE — Patient Instructions (Signed)
Follow up as scheduled. Weigh as often as possible and record. No salt added diet.

## 2011-10-27 NOTE — Assessment & Plan Note (Signed)
Stable by most recent labs, with creatinine 1.6.

## 2011-10-27 NOTE — Assessment & Plan Note (Signed)
Clinically stable on current medication regimen. Would prefer to try to maintain on an ACE inhibitor, albeit at current low-dose, if at all possible. Also, unable to further uptitrate carvedilol.

## 2011-12-28 ENCOUNTER — Ambulatory Visit: Payer: Medicare Other | Admitting: Cardiology

## 2011-12-29 ENCOUNTER — Ambulatory Visit (INDEPENDENT_AMBULATORY_CARE_PROVIDER_SITE_OTHER): Payer: Medicare Other | Admitting: Cardiology

## 2011-12-29 ENCOUNTER — Encounter: Payer: Self-pay | Admitting: Cardiology

## 2011-12-29 VITALS — BP 113/70 | HR 62 | Ht 62.0 in | Wt 158.0 lb

## 2011-12-29 DIAGNOSIS — I255 Ischemic cardiomyopathy: Secondary | ICD-10-CM

## 2011-12-29 DIAGNOSIS — I2589 Other forms of chronic ischemic heart disease: Secondary | ICD-10-CM

## 2011-12-29 MED ORDER — ISOSORBIDE MONONITRATE ER 120 MG PO TB24: ORAL_TABLET | ORAL | Status: DC

## 2011-12-29 MED ORDER — AMLODIPINE BESYLATE 5 MG PO TABS: 5.0000 mg | ORAL_TABLET | Freq: Every day | ORAL | Status: DC

## 2011-12-29 MED ORDER — NITROGLYCERIN 0.4 MG SL SUBL: 0.4000 mg | SUBLINGUAL_TABLET | SUBLINGUAL | Status: DC | PRN

## 2011-12-29 MED ORDER — CARVEDILOL 12.5 MG PO TABS: ORAL_TABLET | ORAL | Status: DC

## 2011-12-29 NOTE — Patient Instructions (Signed)
   Stop Ramipril  Change to Amlodipine 5mg  daily  Increase Carvedilol to 18.75mg  twice a day   Increase Imdur to 240mg  daily Your physician wants you to follow up in: 6 months.  You will receive a reminder letter in the mail one-two months in advance.  If you don't receive a letter, please call our office to schedule the follow up appointment

## 2012-01-02 ENCOUNTER — Encounter: Payer: Self-pay | Admitting: Cardiology

## 2012-01-02 NOTE — Progress Notes (Signed)
Peyton Bottoms, MD, [facility] ABIM Board Certified in Adult Cardiovascular Medicine,Internal Medicine and Critical Care Medicine    CC: followup patient with coronary artery disease and chronic angina  HPI:  The patient is a 78 year old male with a history of severe coronary artery disease, status post non-ST elevation myocardial infarction was to be treated medically.  The patient has a history of chronic stable angina, although he typically uses 20 nitroglycerin tablets a month.  During his recent office visit, he has been placed on isosorbide mononitrate with significant improvement in his angina pattern and he states that he only uses nitroglycerin occasionally, certainly no more than 10 tablets a month.  He also feels his breathing is better.  He denies any chest pain today.  He has no orthopnea, PND, palpitations or syncope.  Again, we discussed further interventional therapy of, coronary artery disease the patient continues to decline.   PMH: reviewed and listed in Problem List in Electronic Records (and see below) Past Medical History  Diagnosis Date  . Acute on chronic systolic heart failure     stable  . Acute respiratory failure   . Acute myocardial infarction, subendocardial infarction, initial episode of care     the patient declined interventional therapy  . Obstructive chronic bronchitis with exacerbation   . Congestive heart failure, unspecified   . Coronary atherosclerosis of native coronary artery   . Anxiety state, unspecified   . Type II or unspecified type diabetes mellitus without mention of complication, not stated as uncontrolled   . Alcoholism /alcohol abuse   . GERD (gastroesophageal reflux disease)   . Stable angina     Frequent use of nitroglycerin improved after starting isosorbide mononitrate   Past Surgical History  Procedure Date  . Hemorroidectomy   . Coronary stent placement     Allergies/SH/FHX : available in Electronic Records for review  Allergies   Allergen Reactions  . Penicillins Swelling    Allergic to injection pcn Patient said he can take the pills   History   Social History  . Marital Status: Single    Spouse Name: N/A    Number of Children: N/A  . Years of Education: N/A   Occupational History  . RETIRED    Social History Main Topics  . Smoking status: Never Smoker   . Smokeless tobacco: Never Used  . Alcohol Use: No     History of Alcohol use  . Drug Use: No  . Sexually Active: Not on file   Other Topics Concern  . Not on file   Social History Narrative   No regular exercise   No family history on file.  Medications: Current Outpatient Prescriptions  Medication Sig Dispense Refill  . ALPRAZolam (XANAX) 0.5 MG tablet Take 0.5 mg by mouth 2 (two) times daily.        Marland Kitchen aspirin 81 MG tablet Take 162 mg by mouth daily.       . carvedilol (COREG) 12.5 MG tablet Take 1 1/2 tabs (18.75mg ) twice a day  90 tablet  6  . clopidogrel (PLAVIX) 75 MG tablet Take 75 mg by mouth daily.        . fish oil-omega-3 fatty acids 1000 MG capsule Take 1 g by mouth 3 (three) times daily.       . furosemide (LASIX) 40 MG tablet Take 40 mg by mouth daily.        Marland Kitchen HYDROcodone-acetaminophen (VICODIN) 5-500 MG per tablet Take 1 tablet by mouth 2 (  two) times daily.        . isosorbide mononitrate (IMDUR) 120 MG 24 hr tablet Take 2 tabs (240mg ) daily  60 tablet  6  . nitroGLYCERIN (NITROSTAT) 0.4 MG SL tablet Place 1 tablet (0.4 mg total) under the tongue every 5 (five) minutes as needed. May use for up to 3 doses.  25 tablet  3  . ranitidine (ZANTAC) 150 MG tablet Take 150 mg by mouth 2 (two) times daily.        . rosuvastatin (CRESTOR) 20 MG tablet Take 20 mg by mouth at bedtime.        Marland Kitchen amLODipine (NORVASC) 5 MG tablet Take 1 tablet (5 mg total) by mouth daily.  30 tablet  6    ROS: No nausea or vomiting. No fever or chills.No melena or hematochezia.No bleeding.No claudication  Physical Exam: BP 113/70  Pulse 62  Ht 5\' 2"   (1.575 m)  Wt 158 lb (71.668 kg)  BMI 28.90 kg/m2 General:well-nourished white male, somewhat ill-appearing but in no distress Neck:normal upstroke.  No carotid bruits.  No thyromegaly.  No nodular thyroid.  JVP is 6-7 cm Lungs:clear breath sounds bilaterally.  No wheezing Cardiac:regular rate and rhythm with normal S1, S2.  No pathological murmurs Vascular:no edema.  Palpable distal pulses bilaterally Skin:warm and dry. Physcologic:normal affect  12lead ECG:not performed Limited bedside ECHO:N/A   Patient Active Problem List  Diagnoses  . HYPERLIPIDEMIA  . HYPERPOTASSEMIA  . ANXIETY STATE, UNSPECIFIED  . HTN CKD UNS W/CKD STAGE I THRU STAGE IV/UNS  . CORONARY ATHEROSCLEROSIS NATIVE CORONARY ARTERY  . ISCHEMIC CARDIOMYOPATHY- A interventional therapy on medical therapy only  . CHRONIC SYSTOLIC HEART FAILURE  . GERD  . DIAPHRAGMAT HERN W/O MENTION OBSTRUCTION/GANGREN  . CHRONIC KIDNEY DISEASE UNSPECIFIED  . SHORTNESS OF BREATH  . ESSENTIAL HYPERTENSION, BENIGN  . Sinus bradycardia  . Diabetes mellitus    PLAN   Significant improvement in the patient's symptoms after starting isosorbide mononitrate.  We'll try to further improve his angina pattern as the patient continues to decline any interventional therapy.  We'll discontinue ramipril and changed to amlodipine, 5 milligrams  Daily as well as increase carvedilol to 18.22milligrams. By mouth twice a day and continue isosorbide, nitroglycerin.  We'll follow the patient closely.  Next office visit.  Bedside echocardiogram and if blood pressurestable, will reintroduce ACE inhibitor if significant LV dysfunction

## 2012-02-07 ENCOUNTER — Telehealth: Payer: Self-pay | Admitting: *Deleted

## 2012-02-07 DIAGNOSIS — E785 Hyperlipidemia, unspecified: Secondary | ICD-10-CM

## 2012-02-07 DIAGNOSIS — Z79899 Other long term (current) drug therapy: Secondary | ICD-10-CM

## 2012-02-07 DIAGNOSIS — I10 Essential (primary) hypertension: Secondary | ICD-10-CM

## 2012-02-07 DIAGNOSIS — R0602 Shortness of breath: Secondary | ICD-10-CM

## 2012-02-07 NOTE — Telephone Encounter (Addendum)
Message left on nurse's voicemail that patient was informed to have lab work done with Dr. Olena Leatherwood office at last office visit.  After reviewing chart, there was no mention of this. Nurse called and informed patient's caregiver that no mention of blood work and if PCP does any labs, have results sent to our office.

## 2012-02-08 ENCOUNTER — Other Ambulatory Visit: Payer: Self-pay | Admitting: *Deleted

## 2012-02-08 DIAGNOSIS — E785 Hyperlipidemia, unspecified: Secondary | ICD-10-CM

## 2012-02-08 DIAGNOSIS — Z79899 Other long term (current) drug therapy: Secondary | ICD-10-CM

## 2012-02-08 DIAGNOSIS — I10 Essential (primary) hypertension: Secondary | ICD-10-CM

## 2012-02-08 DIAGNOSIS — R0602 Shortness of breath: Secondary | ICD-10-CM

## 2012-02-08 NOTE — Telephone Encounter (Signed)
Addended by: Eustace Moore on: 02/08/2012 01:42 PM   Modules accepted: Orders

## 2012-02-08 NOTE — Telephone Encounter (Addendum)
Patient caregiver informed and orders will be sent to Dr. Bartholomew Crews office since he has an appointment on March 8th.

## 2012-02-08 NOTE — Telephone Encounter (Signed)
Usual routine labs including CBC, BMET lipid panel and liver function test.

## 2012-02-16 ENCOUNTER — Other Ambulatory Visit: Payer: Self-pay

## 2012-07-25 ENCOUNTER — Ambulatory Visit (INDEPENDENT_AMBULATORY_CARE_PROVIDER_SITE_OTHER): Payer: Medicare Other | Admitting: Cardiology

## 2012-07-25 ENCOUNTER — Other Ambulatory Visit: Payer: Self-pay | Admitting: Cardiology

## 2012-07-25 VITALS — BP 100/60 | HR 57 | Ht 62.0 in | Wt 159.8 lb

## 2012-07-25 DIAGNOSIS — I2589 Other forms of chronic ischemic heart disease: Secondary | ICD-10-CM

## 2012-07-25 DIAGNOSIS — R0602 Shortness of breath: Secondary | ICD-10-CM

## 2012-07-25 DIAGNOSIS — I255 Ischemic cardiomyopathy: Secondary | ICD-10-CM

## 2012-07-25 DIAGNOSIS — I5022 Chronic systolic (congestive) heart failure: Secondary | ICD-10-CM

## 2012-07-25 DIAGNOSIS — I10 Essential (primary) hypertension: Secondary | ICD-10-CM

## 2012-07-25 NOTE — Assessment & Plan Note (Signed)
Followed by the patient's primary care physician. The patient has blood work done on a regular basis

## 2012-07-25 NOTE — Assessment & Plan Note (Signed)
Blood pressure well controlled

## 2012-07-25 NOTE — Patient Instructions (Addendum)
Your physician recommends that you schedule a follow-up appointment in: 3 months with Dr. Andee Lineman. Your physician has recommended you make the following change in your medication: Increase furosemide 40 mg to 1&1/2 to equal 60 mg daily for 2 days then resume 40 mg daily dosage. All other medications will remain the same. Your physician has requested that you have an echocardiogram. Echocardiography is a painless test that uses sound waves to create images of your heart. It provides your doctor with information about the size and shape of your heart and how well your heart's chambers and valves are working. This procedure takes approximately one hour. There are no restrictions for this procedure.

## 2012-07-25 NOTE — Assessment & Plan Note (Signed)
Patient reports over the last 4 days symptoms of PND which resolves with either putting the panel of take or taking a sublingual nitroglycerin. He also some mild lower extremity edema which could be from amlodipine and we will order an echocardiogram to reassess his ejection fraction

## 2012-07-25 NOTE — Assessment & Plan Note (Signed)
Patient has known severe LV systolic dysfunction. Isosorbide mononitrate of maximal dose. Patient reports some early morning symptoms of acute congestive heart failure and I asked the patient to increase his Lasix for 2 days to 60 mg daily and also takes when necessary nitroglycerin the morning. He continues to have these episodes we will add Ranexa to his medical regimen.

## 2012-07-25 NOTE — Assessment & Plan Note (Signed)
The patient is in NYHA class IIb/3. I told the patient today preemptively nitroglycerin prior to activities. The patient has significant coronary artery disease the last catheterization was felt not to be a candidate for percutaneous coronary intervention.

## 2012-07-25 NOTE — Progress Notes (Signed)
Maurice Bottoms, MD, [facility] ABIM Board Certified in Adult Cardiovascular Medicine,Internal Medicine and Critical Care Medicine    CC:        Follow patient with ischemic cardio myopathy                                                                          HPI:        The patient is 78 year old male ischemic cardiomyopathy ejection fraction 25%. The patient reports ankle edema which has gotten worse over the last 2 weeks. Edema however resolves when he puts his feet on a milk carton. The patient reports shortness of breath on exertion but this resolved with taking sublingual nitroglycerin. At one point the patient is taking 20 nitroglycerin a month but now only takes an occasional nitroglycerin and has not required any refills. Over the last 4 days however he has had a couple of episodes of PND. Sometimes he takes nitroglycerin and at other times he just puts on a fan, both interventions improve his symptoms. Also when picking up limbs he experiences angina shortness of breath which rapidly resolved again with sublingual nitroglycerin. His angina pattern however has remained stable. He denies any orthopnea. He denies any palpitations presyncope or syncope.  PMH: reviewed and listed in Problem List in Electronic Records (and see below) Past Medical History  Diagnosis Date  . Acute on chronic systolic heart failure     stable  . Acute respiratory failure   . Acute myocardial infarction, subendocardial infarction, initial episode of care     the patient declined interventional therapy  . Obstructive chronic bronchitis with exacerbation   . Congestive heart failure, unspecified   . Coronary atherosclerosis of native coronary artery   . Anxiety state, unspecified   . Type II or unspecified type diabetes mellitus without mention of complication, not stated as uncontrolled   . Alcoholism /alcohol abuse   . GERD (gastroesophageal reflux disease)   . Stable angina     Frequent use of nitroglycerin  improved after starting isosorbide mononitrate   Past Surgical History  Procedure Date  . Hemorroidectomy   . Coronary stent placement     Allergies/SH/FHX : available in Electronic Records for review  Allergies  Allergen Reactions  . Penicillins Swelling    Allergic to injection pcn Patient said he can take the pills   History   Social History  . Marital Status: Single    Spouse Name: N/A    Number of Children: N/A  . Years of Education: N/A   Occupational History  . RETIRED    Social History Main Topics  . Smoking status: Never Smoker   . Smokeless tobacco: Never Used  . Alcohol Use: No     History of Alcohol use  . Drug Use: No  . Sexually Active: Not on file   Other Topics Concern  . Not on file   Social History Narrative   No regular exercise   No family history on file.  Medications: Current Outpatient Prescriptions  Medication Sig Dispense Refill  . ALPRAZolam (XANAX) 0.5 MG tablet Take 0.5 mg by mouth 2 (two) times daily.        Marland Kitchen amLODipine (NORVASC) 5 MG tablet  Take 1 tablet (5 mg total) by mouth daily.  30 tablet  6  . aspirin 81 MG tablet Take 162 mg by mouth daily.       . carvedilol (COREG) 12.5 MG tablet Take 1 1/2 tabs (18.75mg ) twice a day  90 tablet  6  . clopidogrel (PLAVIX) 75 MG tablet Take 75 mg by mouth daily.        . fish oil-omega-3 fatty acids 1000 MG capsule Take 1 g by mouth 3 (three) times daily.       . furosemide (LASIX) 40 MG tablet Take 40 mg by mouth daily.        . furosemide (LASIX) 40 MG tablet Take 60 mg by mouth daily. For 2 days only then resume 40 mg daily      . HYDROcodone-acetaminophen (VICODIN) 5-500 MG per tablet Take 1 tablet by mouth 2 (two) times daily.        . isosorbide mononitrate (IMDUR) 120 MG 24 hr tablet Take 2 tabs (240mg ) daily  60 tablet  6  . nitroGLYCERIN (NITROSTAT) 0.4 MG SL tablet Place 1 tablet (0.4 mg total) under the tongue every 5 (five) minutes as needed. May use for up to 3 doses.  25 tablet   3  . ranitidine (ZANTAC) 150 MG tablet Take 150 mg by mouth 2 (two) times daily.        . rosuvastatin (CRESTOR) 20 MG tablet Take 20 mg by mouth at bedtime.          ROS: No nausea or vomiting. No fever or chills.No melena or hematochezia.No bleeding.No claudication  Physical Exam: BP 100/60  Pulse 57  Ht 5\' 2"  (1.575 m)  Wt 159 lb 12 oz (72.462 kg)  BMI 29.22 kg/m2 General: Well-nourished white male in no distress.  Neck: normal carotid upstroke. No carotid bruits no thyromegaly nonnodular thyroid. JVP 7 cm Lungs: Clear breath sounds bilaterally no wheezing. Cardiac: Regular rate and rhythm with normal S1-S2 no murmurs or gallops Vascular: 2+ ankle edema and feet edema. Normal distal pulses no cyanosis or clubbing Skin: Warm and dry Physcologic: Normal affect  12lead ECG: Normal sinus rhythm left axis deviation, right bundle branch block old lateral infarct pattern. Limited bedside ECHO:N/A No images are attached to the encounter.   I reviewed and summarized the old records. I reviewed ECG and prior blood work.  Assessment and Plan  CHRONIC KIDNEY DISEASE UNSPECIFIED Followed by the patient's primary care physician. The patient has blood work done on a regular basis  CHRONIC SYSTOLIC HEART FAILURE Patient reports over the last 4 days symptoms of PND which resolves with either putting the panel of take or taking a sublingual nitroglycerin. He also some mild lower extremity edema which could be from amlodipine and we will order an echocardiogram to reassess his ejection fraction  ESSENTIAL HYPERTENSION, BENIGN Blood pressure well controlled.  ISCHEMIC CARDIOMYOPATHY Patient has known severe LV systolic dysfunction. Isosorbide mononitrate of maximal dose. Patient reports some early morning symptoms of acute congestive heart failure and I asked the patient to increase his Lasix for 2 days to 60 mg daily and also takes when necessary nitroglycerin the morning. He continues to  have these episodes we will add Ranexa to his medical regimen.  SHORTNESS OF BREATH The patient is in NYHA class IIb/3. I told the patient today preemptively nitroglycerin prior to activities. The patient has significant coronary artery disease the last catheterization was felt not to be a candidate for percutaneous coronary intervention.  Patient Active Problem List  Diagnosis  . HYPERLIPIDEMIA  . HYPERPOTASSEMIA  . ANXIETY STATE, UNSPECIFIED  . HTN CKD UNS W/CKD STAGE I THRU STAGE IV/UNS  . CORONARY ATHEROSCLEROSIS NATIVE CORONARY ARTERY  . ISCHEMIC CARDIOMYOPATHY  . CHRONIC SYSTOLIC HEART FAILURE  . GERD  . DIAPHRAGMAT HERN W/O MENTION OBSTRUCTION/GANGREN  . CHRONIC KIDNEY DISEASE UNSPECIFIED  . SHORTNESS OF BREATH  . ESSENTIAL HYPERTENSION, BENIGN  . Sinus bradycardia  . Diabetes mellitus

## 2012-08-09 ENCOUNTER — Other Ambulatory Visit (INDEPENDENT_AMBULATORY_CARE_PROVIDER_SITE_OTHER): Payer: Medicare Other

## 2012-08-09 ENCOUNTER — Other Ambulatory Visit: Payer: Medicare Other

## 2012-08-09 ENCOUNTER — Other Ambulatory Visit: Payer: Self-pay

## 2012-08-09 DIAGNOSIS — I255 Ischemic cardiomyopathy: Secondary | ICD-10-CM

## 2012-08-09 DIAGNOSIS — I2589 Other forms of chronic ischemic heart disease: Secondary | ICD-10-CM

## 2012-08-09 DIAGNOSIS — I5022 Chronic systolic (congestive) heart failure: Secondary | ICD-10-CM

## 2012-08-18 ENCOUNTER — Encounter: Payer: Self-pay | Admitting: *Deleted

## 2012-09-22 ENCOUNTER — Other Ambulatory Visit: Payer: Self-pay | Admitting: Cardiology

## 2012-11-30 ENCOUNTER — Encounter: Payer: Self-pay | Admitting: Cardiovascular Disease

## 2012-11-30 ENCOUNTER — Ambulatory Visit (INDEPENDENT_AMBULATORY_CARE_PROVIDER_SITE_OTHER): Payer: Medicare Other | Admitting: Cardiovascular Disease

## 2012-11-30 VITALS — BP 119/64 | HR 53 | Ht 62.0 in | Wt 164.1 lb

## 2012-11-30 DIAGNOSIS — I2510 Atherosclerotic heart disease of native coronary artery without angina pectoris: Secondary | ICD-10-CM

## 2012-11-30 DIAGNOSIS — I5022 Chronic systolic (congestive) heart failure: Secondary | ICD-10-CM

## 2012-11-30 DIAGNOSIS — I10 Essential (primary) hypertension: Secondary | ICD-10-CM

## 2012-11-30 NOTE — Patient Instructions (Addendum)
Continue same medications.  Follow up in 6 months.  

## 2012-11-30 NOTE — Progress Notes (Signed)
HPI  The patient is 78 year old male who is here today for a followup visit. He has known history of significant three-vessel coronary artery disease not approachable percutaneously. The patient has refused bypass in the past and has been managed medically. He has known ischemic cardiomyopathy with an  ejection fraction 25%. he reports feeling well overall. Occasionally he has to take nitroglycerin sublingual but that has gradually improved. He has chronic exertional dyspnea which he reports has improved the last 6 months. Overall he feels well.  Allergies  Allergen Reactions  . Penicillins Swelling    Allergic to injection pcn Patient said he can take the pills     Current Outpatient Prescriptions on File Prior to Visit  Medication Sig Dispense Refill  . ALPRAZolam (XANAX) 0.5 MG tablet Take 0.5 mg by mouth 2 (two) times daily.        Marland Kitchen amLODipine (NORVASC) 5 MG tablet TAKE ONE TABLET BY MOUTH ONCE DAILY  30 tablet  3  . aspirin 81 MG tablet Take 162 mg by mouth daily.       . carvedilol (COREG) 12.5 MG tablet TAKE 1 AND 1/2 TABLETS TWICE DAILY  45 tablet  3  . clopidogrel (PLAVIX) 75 MG tablet Take 75 mg by mouth daily.        . fish oil-omega-3 fatty acids 1000 MG capsule Take 1 g by mouth 3 (three) times daily.       . furosemide (LASIX) 40 MG tablet Take 40 mg by mouth daily.        . furosemide (LASIX) 40 MG tablet Take 60 mg by mouth daily. For 2 days only then resume 40 mg daily      . HYDROcodone-acetaminophen (VICODIN) 5-500 MG per tablet Take 1 tablet by mouth 2 (two) times daily.        . isosorbide mononitrate (IMDUR) 120 MG 24 hr tablet TAKE TWO TABLETS ONCE DAILY  60 tablet  3  . NITROSTAT 0.4 MG SL tablet Place 1 tablet (0.4 mg total) under the tongue every 5 (five) minutes as needed. May use for up to 3 doses.  25 tablet  1  . ranitidine (ZANTAC) 150 MG tablet Take 150 mg by mouth 2 (two) times daily.        . rosuvastatin (CRESTOR) 20 MG tablet Take 20 mg by mouth at  bedtime.           Past Medical History  Diagnosis Date  . Acute on chronic systolic heart failure     stable  . Acute respiratory failure   . Acute myocardial infarction, subendocardial infarction, initial episode of care     the patient declined interventional therapy  . Obstructive chronic bronchitis with exacerbation   . Congestive heart failure, unspecified   . Coronary atherosclerosis of native coronary artery   . Anxiety state, unspecified   . Type II or unspecified type diabetes mellitus without mention of complication, not stated as uncontrolled   . Alcoholism /alcohol abuse   . GERD (gastroesophageal reflux disease)   . Stable angina     Frequent use of nitroglycerin improved after starting isosorbide mononitrate     Past Surgical History  Procedure Date  . Hemorroidectomy   . Coronary stent placement      No family history on file.   History   Social History  . Marital Status: Single    Spouse Name: N/A    Number of Children: N/A  . Years of Education: N/A  Occupational History  . RETIRED    Social History Main Topics  . Smoking status: Never Smoker   . Smokeless tobacco: Never Used  . Alcohol Use: No     Comment: History of Alcohol use  . Drug Use: No  . Sexually Active: Not on file   Other Topics Concern  . Not on file   Social History Narrative   No regular exercise     PHYSICAL EXAM   BP 119/64  Pulse 53  Ht 5\' 2"  (1.575 m)  Wt 164 lb 1.9 oz (74.444 kg)  BMI 30.02 kg/m2 Constitutional: He is oriented to person, place, and time. He appears well-developed and well-nourished. No distress.  HENT: No nasal discharge.  Head: Normocephalic and atraumatic.  Eyes: Pupils are equal and round. Right eye exhibits no discharge. Left eye exhibits no discharge.  Neck: Normal range of motion. Neck supple. No JVD present. No thyromegaly present.  Cardiovascular: Normal rate, regular rhythm, normal heart sounds and. Exam reveals no gallop and no  friction rub. No murmur heard.  Pulmonary/Chest: Effort normal and breath sounds normal. No stridor. No respiratory distress. He has no wheezes. He has no rales. He exhibits no tenderness.  Abdominal: Soft. Bowel sounds are normal. He exhibits no distension. There is no tenderness. There is no rebound and no guarding.  Musculoskeletal: Normal range of motion. He exhibits no edema and no tenderness.  Neurological: He is alert and oriented to person, place, and time. Coordination normal.  Skin: Skin is warm and dry. No rash noted. He is not diaphoretic. No erythema. No pallor.  Psychiatric: He has a normal mood and affect. His behavior is normal. Judgment and thought content normal.     ASSESSMENT AND PLAN

## 2012-11-30 NOTE — Assessment & Plan Note (Signed)
He is currently New York Heart Association class II-III. Known severely reduced LV systolic function. The patient appears to be euvolemic. Continue current dose of Lasix. Continue Coreg. He is not on an ACE inhibitor which I suspect is due to chronic kidney disease and hyperkalemia. Continue long-acting nitrates and consider adding a small dose hydralazine in the future if blood pressure allows. An ICD is not recommended due to age and functional status in no revascularization.

## 2012-11-30 NOTE — Assessment & Plan Note (Signed)
Blood pressure is well controlled on current medications. 

## 2012-11-30 NOTE — Assessment & Plan Note (Signed)
The patient seems to have stable angina with improvement in symptoms with medical therapy.

## 2012-12-19 ENCOUNTER — Telehealth: Payer: Self-pay | Admitting: Physician Assistant

## 2012-12-19 MED ORDER — NITROGLYCERIN 0.4 MG SL SUBL: 0.4000 mg | SUBLINGUAL_TABLET | SUBLINGUAL | Status: AC | PRN

## 2012-12-19 NOTE — Telephone Encounter (Signed)
Needs refill on nitroGLYCERIN (NITROSTAT) 0.4 MG SL table States that [facility] has faxed request but no response from our office. Please verify with patient.

## 2012-12-21 ENCOUNTER — Other Ambulatory Visit: Payer: Self-pay | Admitting: Cardiology

## 2012-12-21 MED ORDER — ISOSORBIDE MONONITRATE ER 120 MG PO TB24: 240.0000 mg | ORAL_TABLET | Freq: Every day | ORAL | Status: DC

## 2013-05-12 ENCOUNTER — Encounter: Payer: Self-pay | Admitting: Physician Assistant

## 2013-05-13 ENCOUNTER — Encounter: Payer: Self-pay | Admitting: Physician Assistant

## 2013-05-15 ENCOUNTER — Encounter: Payer: Self-pay | Admitting: Physician Assistant

## 2013-07-05 ENCOUNTER — Ambulatory Visit (INDEPENDENT_AMBULATORY_CARE_PROVIDER_SITE_OTHER): Payer: Medicare Other | Admitting: Cardiovascular Disease

## 2013-07-05 ENCOUNTER — Encounter: Payer: Self-pay | Admitting: Cardiovascular Disease

## 2013-07-05 VITALS — BP 106/63 | HR 58 | Ht 62.5 in | Wt 153.0 lb

## 2013-07-05 DIAGNOSIS — I10 Essential (primary) hypertension: Secondary | ICD-10-CM

## 2013-07-05 DIAGNOSIS — I5022 Chronic systolic (congestive) heart failure: Secondary | ICD-10-CM

## 2013-07-05 DIAGNOSIS — I2510 Atherosclerotic heart disease of native coronary artery without angina pectoris: Secondary | ICD-10-CM

## 2013-07-05 NOTE — Progress Notes (Signed)
Patient ID: Maurice Jennings, male   DOB: 1930-02-20, 79 y.o.   MRN: [id number]    SUBJECTIVE:  Maurice Jennings is an 79 year old male with an ischemic cardiomyopathy, ejection fraction 35-40% (August 2013). He has a known history of significant three-vessel coronary artery disease not approachable percutaneously. The patient has refused bypass in the past and has been managed medically.  He has some right ankle edema which has gotten worse over the last 2 weeks. The edema resolves when he puts his feet on a milk carton. The patient reports shortness of breath on exertion but this resolved with taking sublingual nitroglycerin but moreso with nebulizer treatments, which he takes 4 times daily. He says he rarely has to take nitro, and really only when he is weeding or mowing the lawn. He denies any orthopnea. He denies any chest pain, palpitations, dizziness, presyncope or syncope.   PMH: reviewed and listed in Problem List in Electronic Records (and see below)  Past Medical History   Diagnosis  Date   .  Acute on chronic systolic heart failure      stable   .  Acute respiratory failure    .  Acute myocardial infarction, subendocardial infarction, initial episode of care      the patient declined interventional therapy   .  Obstructive chronic bronchitis with exacerbation    .  Congestive heart failure, unspecified    .  Coronary atherosclerosis of native coronary artery    .  Anxiety state, unspecified    .  Type II or unspecified type diabetes mellitus without mention of complication, not stated as uncontrolled    .  Alcoholism /alcohol abuse    .  GERD (gastroesophageal reflux disease)    .  Stable angina      Frequent use of nitroglycerin improved after starting isosorbide mononitrate    Past Surgical History   Procedure  Date   .  Hemorroidectomy    .  Coronary stent placement    Allergies/SH/FHX : available in Electronic Records for review  Allergies   Allergen  Reactions   .   Penicillins  Swelling     Allergic to injection pcn  Patient said he can take the pills     Filed Vitals:   07/05/13 0911  BP: 106/63  Pulse: 58  Height: 5' 2.5" (1.588 m)  Weight: 153 lb (69.4 kg)    PHYSICAL EXAM  General: NAD Neck: No JVD, no thyromegaly or thyroid nodule.  Lungs: Clear to auscultation bilaterally with shallow respiratory effort. CV: Nondisplaced PMI.  Heart regular S1/S2, no S3/S4, no murmur.  No carotid bruit.  Normal pedal pulses.  Abdomen: Soft, nontender, no hepatosplenomegaly, no distention.  Neurologic: Alert and oriented x 3.  Psych: Normal affect. Extremities: No clubbing or cyanosis. Right ankle edema, with trace pretibial edema in right leg.    LABS: Basic Metabolic Panel: No results found for this basename: NA, K, CL, CO2, GLUCOSE, BUN, CREATININE, CALCIUM, MG, PHOS,  in the last 72 hours Liver Function Tests: No results found for this basename: AST, ALT, ALKPHOS, BILITOT, PROT, ALBUMIN,  in the last 72 hours No results found for this basename: LIPASE, AMYLASE,  in the last 72 hours CBC: No results found for this basename: WBC, NEUTROABS, HGB, HCT, MCV, PLT,  in the last 72 hours Cardiac Enzymes: No results found for this basename: CKTOTAL, CKMB, CKMBINDEX, TROPONINI,  in the last 72 hours BNP: No components found with this basename: POCBNP,  D-Dimer:  No results found for this basename: DDIMER,  in the last 72 hours Hemoglobin A1C: No results found for this basename: HGBA1C,  in the last 72 hours Fasting Lipid Panel: No results found for this basename: CHOL, HDL, LDLCALC, TRIG, CHOLHDL, LDLDIRECT,  in the last 72 hours Thyroid Function Tests: No results found for this basename: TSH, T4TOTAL, FREET3, T3FREE, THYROIDAB,  in the last 72 hours Anemia Panel: No results found for this basename: VITAMINB12, FOLATE, FERRITIN, TIBC, IRON, RETICCTPCT,  in the last 72 hours   Echo: August 2013 - Left ventricle: The cavity size was normal. Wall  thickness was increased in a pattern of mild LVH. Systolic function was moderately reduced. The estimated ejection fraction was in the range of 35% to 40%. Doppler parameters are consistent with abnormal left ventricular relaxation (grade 1 diastolic dysfunction). Doppler parameters are consistent with elevated mean left atrial filling pressure. - Regional wall motion abnormality: Akinesis of the mid inferoseptal, entire inferior, basal-mid inferolateral, apical septal, and apical myocardium. - Aortic valve: Mildly calcified annulus. Trileaflet; moderately thickened, moderately calcified leaflets. Valve area: 2.64cm^2(VTI). Valve area: 2.88cm^2 (Vmax). - Left atrium: The atrium was mildly dilated. - Right atrium: Central venous pressure: 5mm Hg (est).    ASSESSMENT AND PLAN:  CHRONIC SYSTOLIC HEART FAILURE  Appears to be well compensated overall. His ankle edema could be secondary to Amlodipine, but he is not bothered by it and it is relieved with leg elevation. On Coreg and not on ACEI apparently due to CKD and hyperkalemia. He is currently New York Heart Association class II-III. Continue long-acting nitrates.  CAD (3-vessel) On ASA and Plavix, as well as Crestor.  ESSENTIAL HYPERTENSION, BENIGN  Blood pressure well controlled.       Prentice Docker, M.D., F.A.C.C.

## 2013-07-05 NOTE — Patient Instructions (Addendum)
Your physician wants you to follow-up in: 6 MONTH You will receive a reminder letter in the mail two months in advance. If you don't receive a letter, please call our office to schedule the follow-up appointment.  Your physician recommends that you continue on your current medications as directed. Please refer to the Current Medication list given to you today.   

## 2013-07-19 ENCOUNTER — Other Ambulatory Visit: Payer: Self-pay | Admitting: *Deleted

## 2013-07-19 MED ORDER — ISOSORBIDE MONONITRATE ER 120 MG PO TB24: 240.0000 mg | ORAL_TABLET | Freq: Every day | ORAL | Status: DC

## 2014-01-01 ENCOUNTER — Ambulatory Visit: Payer: Medicare Other | Admitting: Cardiovascular Disease

## 2014-01-24 ENCOUNTER — Encounter: Payer: Self-pay | Admitting: Cardiovascular Disease

## 2014-04-01 ENCOUNTER — Telehealth: Payer: Self-pay | Admitting: Cardiovascular Disease

## 2014-04-01 NOTE — Telephone Encounter (Signed)
Unable to contact patient to schedule appt from last No Show due to voicemail being full

## 2015-01-14 ENCOUNTER — Encounter (HOSPITAL_COMMUNITY): Payer: Self-pay | Admitting: *Deleted

## 2015-01-14 ENCOUNTER — Ambulatory Visit (HOSPITAL_COMMUNITY): Payer: Medicare Other

## 2015-01-14 ENCOUNTER — Inpatient Hospital Stay (HOSPITAL_COMMUNITY)
Admission: EM | Admit: 2015-01-14 | Discharge: 2015-01-17 | DRG: 683 | Disposition: A | Discharge status: Home / Self Care | Payer: Medicare Other | Source: Intra-hospital | Attending: Internal Medicine | Admitting: Internal Medicine

## 2015-01-14 DIAGNOSIS — R0789 Other chest pain: Secondary | ICD-10-CM

## 2015-01-14 DIAGNOSIS — E785 Hyperlipidemia, unspecified: Secondary | ICD-10-CM | POA: Diagnosis present

## 2015-01-14 DIAGNOSIS — I252 Old myocardial infarction: Secondary | ICD-10-CM

## 2015-01-14 DIAGNOSIS — I200 Unstable angina: Secondary | ICD-10-CM

## 2015-01-14 DIAGNOSIS — F1020 Alcohol dependence, uncomplicated: Secondary | ICD-10-CM | POA: Diagnosis present

## 2015-01-14 DIAGNOSIS — I2510 Atherosclerotic heart disease of native coronary artery without angina pectoris: Secondary | ICD-10-CM | POA: Diagnosis not present

## 2015-01-14 DIAGNOSIS — Z7982 Long term (current) use of aspirin: Secondary | ICD-10-CM

## 2015-01-14 DIAGNOSIS — I25110 Atherosclerotic heart disease of native coronary artery with unstable angina pectoris: Secondary | ICD-10-CM | POA: Diagnosis present

## 2015-01-14 DIAGNOSIS — I10 Essential (primary) hypertension: Secondary | ICD-10-CM | POA: Diagnosis present

## 2015-01-14 DIAGNOSIS — Z66 Do not resuscitate: Secondary | ICD-10-CM | POA: Diagnosis present

## 2015-01-14 DIAGNOSIS — R079 Chest pain, unspecified: Secondary | ICD-10-CM

## 2015-01-14 DIAGNOSIS — R109 Unspecified abdominal pain: Secondary | ICD-10-CM

## 2015-01-14 DIAGNOSIS — Z880 Allergy status to penicillin: Secondary | ICD-10-CM

## 2015-01-14 DIAGNOSIS — N179 Acute kidney failure, unspecified: Principal | ICD-10-CM | POA: Diagnosis present

## 2015-01-14 DIAGNOSIS — I5022 Chronic systolic (congestive) heart failure: Secondary | ICD-10-CM | POA: Diagnosis present

## 2015-01-14 DIAGNOSIS — R110 Nausea: Secondary | ICD-10-CM | POA: Insufficient documentation

## 2015-01-14 DIAGNOSIS — E119 Type 2 diabetes mellitus without complications: Secondary | ICD-10-CM | POA: Diagnosis present

## 2015-01-14 DIAGNOSIS — A09 Infectious gastroenteritis and colitis, unspecified: Secondary | ICD-10-CM | POA: Diagnosis present

## 2015-01-14 DIAGNOSIS — R112 Nausea with vomiting, unspecified: Secondary | ICD-10-CM | POA: Diagnosis present

## 2015-01-14 DIAGNOSIS — I255 Ischemic cardiomyopathy: Secondary | ICD-10-CM | POA: Diagnosis present

## 2015-01-14 DIAGNOSIS — F419 Anxiety disorder, unspecified: Secondary | ICD-10-CM | POA: Diagnosis present

## 2015-01-14 DIAGNOSIS — K219 Gastro-esophageal reflux disease without esophagitis: Secondary | ICD-10-CM | POA: Diagnosis present

## 2015-01-14 DIAGNOSIS — E860 Dehydration: Secondary | ICD-10-CM | POA: Diagnosis present

## 2015-01-14 DIAGNOSIS — N289 Disorder of kidney and ureter, unspecified: Secondary | ICD-10-CM | POA: Insufficient documentation

## 2015-01-14 DIAGNOSIS — K5289 Other specified noninfective gastroenteritis and colitis: Secondary | ICD-10-CM | POA: Insufficient documentation

## 2015-01-14 DIAGNOSIS — Z955 Presence of coronary angioplasty implant and graft: Secondary | ICD-10-CM

## 2015-01-14 HISTORY — DX: Essential (primary) hypertension: I10

## 2015-01-14 LAB — CBC
HEMATOCRIT: 45.9 % (ref 39.0–52.0)
Hemoglobin: 15.4 g/dL (ref 13.0–17.0)
MCH: 30.9 pg (ref 26.0–34.0)
MCHC: 33.6 g/dL (ref 30.0–36.0)
MCV: 92.2 fL (ref 78.0–100.0)
PLATELETS: 194 10*3/uL (ref 150–400)
RBC: 4.98 MIL/uL (ref 4.22–5.81)
RDW: 14.4 % (ref 11.5–15.5)
WBC: 5.1 10*3/uL (ref 4.0–10.5)

## 2015-01-14 LAB — COMPREHENSIVE METABOLIC PANEL
ALK PHOS: 86 U/L (ref 39–117)
ALT: 11 U/L (ref 0–53)
ANION GAP: 14 (ref 5–15)
AST: 19 U/L (ref 0–37)
Albumin: 4.5 g/dL (ref 3.5–5.2)
BUN: 52 mg/dL — AB (ref 6–23)
CO2: 24 mmol/L (ref 19–32)
Calcium: 9.5 mg/dL (ref 8.4–10.5)
Chloride: 98 mmol/L (ref 96–112)
Creatinine, Ser: 3.48 mg/dL — ABNORMAL HIGH (ref 0.50–1.35)
GFR calc Af Amer: 17 mL/min — ABNORMAL LOW (ref >90)
GFR calc non Af Amer: 15 mL/min — ABNORMAL LOW (ref >90)
GLUCOSE: 156 mg/dL — AB (ref 70–99)
Potassium: 4.9 mmol/L (ref 3.5–5.1)
SODIUM: 136 mmol/L (ref 135–145)
Total Bilirubin: 0.8 mg/dL (ref 0.3–1.2)
Total Protein: 7.8 g/dL (ref 6.0–8.3)

## 2015-01-14 LAB — I-STAT TROPONIN, ED: TROPONIN I, POC: 0.06 ng/mL (ref 0.00–0.08)

## 2015-01-14 LAB — CBC WITH DIFFERENTIAL/PLATELET
BASOS ABS: 0 10*3/uL (ref 0.0–0.1)
Basophils Relative: 0 % (ref 0–1)
EOS PCT: 0 % (ref 0–5)
Eosinophils Absolute: 0 10*3/uL (ref 0.0–0.7)
HCT: 43.7 % (ref 39.0–52.0)
Hemoglobin: 14.5 g/dL (ref 13.0–17.0)
LYMPHS PCT: 31 % (ref 12–46)
Lymphs Abs: 1.6 10*3/uL (ref 0.7–4.0)
MCH: 29.9 pg (ref 26.0–34.0)
MCHC: 33.2 g/dL (ref 30.0–36.0)
MCV: 90.1 fL (ref 78.0–100.0)
Monocytes Absolute: 1 10*3/uL (ref 0.1–1.0)
Monocytes Relative: 19 % — ABNORMAL HIGH (ref 3–12)
Neutro Abs: 2.6 10*3/uL (ref 1.7–7.7)
Neutrophils Relative %: 50 % (ref 43–77)
PLATELETS: 206 10*3/uL (ref 150–400)
RBC: 4.85 MIL/uL (ref 4.22–5.81)
RDW: 14.2 % (ref 11.5–15.5)
WBC: 5.2 10*3/uL (ref 4.0–10.5)

## 2015-01-14 LAB — CREATININE, SERUM
Creatinine, Ser: 3.46 mg/dL — ABNORMAL HIGH (ref 0.50–1.35)
GFR, EST AFRICAN AMERICAN: 18 mL/min — AB (ref >90)
GFR, EST NON AFRICAN AMERICAN: 15 mL/min — AB (ref >90)

## 2015-01-14 LAB — I-STAT CHEM 8, ED
BUN: 52 mg/dL — ABNORMAL HIGH (ref 6–23)
CHLORIDE: 101 mmol/L (ref 96–112)
Calcium, Ion: 1.12 mmol/L — ABNORMAL LOW (ref 1.13–1.30)
Creatinine, Ser: 3.1 mg/dL — ABNORMAL HIGH (ref 0.50–1.35)
Glucose, Bld: 153 mg/dL — ABNORMAL HIGH (ref 70–99)
HCT: 50 % (ref 39.0–52.0)
Hemoglobin: 17 g/dL (ref 13.0–17.0)
Potassium: 4.8 mmol/L (ref 3.5–5.1)
Sodium: 136 mmol/L (ref 135–145)
TCO2: 21 mmol/L (ref 0–100)

## 2015-01-14 LAB — PROTIME-INR
INR: 1.08 (ref 0.00–1.49)
Prothrombin Time: 14.1 seconds (ref 11.6–15.2)

## 2015-01-14 LAB — APTT: APTT: 31 s (ref 24–37)

## 2015-01-14 LAB — TROPONIN I
TROPONIN I: 0.09 ng/mL — AB (ref <0.031)
Troponin I: 0.07 ng/mL — ABNORMAL HIGH (ref <0.031)
Troponin I: 0.07 ng/mL — ABNORMAL HIGH (ref <0.031)

## 2015-01-14 LAB — MAGNESIUM: Magnesium: 1.9 mg/dL (ref 1.5–2.5)

## 2015-01-14 MED ORDER — SACCHAROMYCES BOULARDII 250 MG PO CAPS
250.0000 mg | ORAL_CAPSULE | Freq: Two times a day (BID) | ORAL | Status: DC
  Administered 2015-01-14 – 2015-01-17 (×6): 250 mg via ORAL
  Filled 2015-01-14 (×7): qty 1

## 2015-01-14 MED ORDER — MORPHINE SULFATE 2 MG/ML IJ SOLN
1.0000 mg | INTRAMUSCULAR | Status: DC | PRN
  Administered 2015-01-14: 1 mg via INTRAVENOUS
  Filled 2015-01-14: qty 1

## 2015-01-14 MED ORDER — ONDANSETRON HCL 4 MG PO TABS: 4.0000 mg | ORAL_TABLET | Freq: Four times a day (QID) | ORAL | Status: DC | PRN

## 2015-01-14 MED ORDER — ATORVASTATIN CALCIUM 20 MG PO TABS
20.0000 mg | ORAL_TABLET | Freq: Every day | ORAL | Status: DC
  Administered 2015-01-14 – 2015-01-16 (×3): 20 mg via ORAL
  Filled 2015-01-14 (×4): qty 1

## 2015-01-14 MED ORDER — ACETAMINOPHEN 325 MG PO TABS: 650.0000 mg | ORAL_TABLET | Freq: Four times a day (QID) | ORAL | Status: DC | PRN

## 2015-01-14 MED ORDER — HEPARIN SODIUM (PORCINE) 5000 UNIT/ML IJ SOLN
5000.0000 [IU] | Freq: Three times a day (TID) | INTRAMUSCULAR | Status: DC
Start: 2015-01-14 — End: 2015-01-16
  Administered 2015-01-15 – 2015-01-16 (×3): 5000 [IU] via SUBCUTANEOUS
  Filled 2015-01-14 (×6): qty 1

## 2015-01-14 MED ORDER — ONDANSETRON HCL 4 MG/2ML IJ SOLN: 4.0000 mg | Freq: Four times a day (QID) | INTRAMUSCULAR | Status: DC | PRN

## 2015-01-14 MED ORDER — SODIUM CHLORIDE 0.9 % IJ SOLN
3.0000 mL | Freq: Two times a day (BID) | INTRAMUSCULAR | Status: DC
  Administered 2015-01-14 – 2015-01-16 (×4): 3 mL via INTRAVENOUS

## 2015-01-14 MED ORDER — ASPIRIN EC 325 MG PO TBEC
325.0000 mg | DELAYED_RELEASE_TABLET | Freq: Every day | ORAL | Status: DC
  Administered 2015-01-14 – 2015-01-17 (×4): 325 mg via ORAL
  Filled 2015-01-14 (×4): qty 1

## 2015-01-14 MED ORDER — CARVEDILOL 6.25 MG PO TABS
6.2500 mg | ORAL_TABLET | Freq: Two times a day (BID) | ORAL | Status: DC
  Administered 2015-01-14 – 2015-01-16 (×4): 6.25 mg via ORAL
  Filled 2015-01-14 (×8): qty 1

## 2015-01-14 MED ORDER — ALBUTEROL SULFATE (2.5 MG/3ML) 0.083% IN NEBU: 2.5000 mg | INHALATION_SOLUTION | RESPIRATORY_TRACT | Status: DC | PRN

## 2015-01-14 MED ORDER — ACETAMINOPHEN 650 MG RE SUPP: 650.0000 mg | Freq: Four times a day (QID) | RECTAL | Status: DC | PRN

## 2015-01-14 MED ORDER — OXYCODONE HCL 5 MG PO TABS
5.0000 mg | ORAL_TABLET | ORAL | Status: DC | PRN
  Administered 2015-01-15: 5 mg via ORAL
  Filled 2015-01-14: qty 1

## 2015-01-14 MED ORDER — GUAIFENESIN-DM 100-10 MG/5ML PO SYRP: 5.0000 mL | ORAL_SOLUTION | ORAL | Status: DC | PRN

## 2015-01-14 MED ORDER — SODIUM CHLORIDE 0.9 % IV SOLN
INTRAVENOUS | Status: DC
  Administered 2015-01-14 – 2015-01-16 (×4): via INTRAVENOUS

## 2015-01-14 MED ORDER — ISOSORBIDE MONONITRATE ER 30 MG PO TB24
30.0000 mg | ORAL_TABLET | Freq: Every day | ORAL | Status: DC
  Administered 2015-01-14 – 2015-01-17 (×4): 30 mg via ORAL
  Filled 2015-01-14 (×5): qty 1

## 2015-01-14 NOTE — ED Notes (Addendum)
Pt reports he is DNR.

## 2015-01-14 NOTE — ED Notes (Signed)
Paged admitting doctor. Made aware pt reports central cp 3/10, worse with deep breathing. Reports pt can still go to telemetry bed on 3E. Give morphine for pain.

## 2015-01-14 NOTE — H&P (Addendum)
PATIENT DETAILS Name: Maurice Jennings Age: 81 y.o. Sex: male Date of Birth: 1930/05/28 Admit Date: 01/14/2015 ZOX:[id number],UJWJ A, MD   CHIEF COMPLAINT:  Vomiting with diarrhea for the past 24 days Chest pain today  HPI: Maurice Jennings is a 81 y.o. male with a Past Medical History of CAD-3 vessel disease not approachable percutaneously who has refused CABG in the past-managed medically, hypertension, dyslipidemia who presents today with the above noted complaint. Per patient, approximately 3-4 days back, he went to a party where he ate homemade soup, following that he started having numerous episodes of vomiting and diarrhea. Earlier today, he experienced severe chest pain, his neighbor called EMS and brought him to the hospital, EKG showed ST segment elevation in the inferior leads. Cardiology was consulted, however patient refused cardiac cath, also his electrolytes showed acute renal failure. He was then referred to the hospitalist service for medical admission No history of fever, headache, shortness of breath. Patient claims to be very active at baseline, mows his own yard and is able to climb a 16 foot ladder as well. During my evaluation, patient was chest pain-free. He had no further nausea, vomiting or diarrhea while in the emergency room.  ALLERGIES:   Allergies  Allergen Reactions  . Penicillins Swelling    Allergic to injection pcn Patient said he can take the pills    PAST MEDICAL HISTORY: Past Medical History  Diagnosis Date  . Acute on chronic systolic heart failure     stable  . Acute respiratory failure   . Acute myocardial infarction, subendocardial infarction, initial episode of care     the patient declined interventional therapy  . Obstructive chronic bronchitis with exacerbation   . Congestive heart failure, unspecified   . Coronary atherosclerosis of native coronary artery   . Anxiety state, unspecified   . Type II or unspecified type diabetes  mellitus without mention of complication, not stated as uncontrolled   . Alcoholism /alcohol abuse   . GERD (gastroesophageal reflux disease)   . Stable angina     Frequent use of nitroglycerin improved after starting isosorbide mononitrate  . Hypertension     PAST SURGICAL HISTORY: Past Surgical History  Procedure Laterality Date  . Hemorroidectomy    . Coronary stent placement      MEDICATIONS AT HOME: Prior to Admission medications   Medication Sig Start Date End Date Taking? Authorizing Provider  albuterol (PROVENTIL HFA;VENTOLIN HFA) 108 (90 BASE) MCG/ACT inhaler Inhale 2 puffs into the lungs 4 (four) times daily.    Historical Provider, MD  albuterol (PROVENTIL) (2.5 MG/3ML) 0.083% nebulizer solution Take 2.5 mg by nebulization 4 (four) times daily.    Historical Provider, MD  ALPRAZolam Prudy Feeler) 0.5 MG tablet Take 0.5 mg by mouth 2 (two) times daily.      Historical Provider, MD  amLODipine (NORVASC) 5 MG tablet TAKE ONE TABLET BY MOUTH ONCE DAILY 07/25/12   June Leap, MD  aspirin 81 MG tablet Take 162 mg by mouth daily.     Historical Provider, MD  carvedilol (COREG) 12.5 MG tablet TAKE 1 AND 1/2 TABLETS TWICE DAILY 07/25/12   June Leap, MD  clopidogrel (PLAVIX) 75 MG tablet Take 75 mg by mouth daily.      Historical Provider, MD  fish oil-omega-3 fatty acids 1000 MG capsule Take 1 g by mouth 3 (three) times daily.     Historical Provider, MD  furosemide (LASIX) 40 MG  tablet Take 40 mg by mouth daily.      Historical Provider, MD  HYDROcodone-acetaminophen (NORCO/VICODIN) 5-325 MG per tablet Take 1 tablet by mouth every 6 (six) hours as needed for pain.    Historical Provider, MD  isosorbide mononitrate (IMDUR) 120 MG 24 hr tablet Take 2 tablets (240 mg total) by mouth daily. 07/19/13   Laqueta Linden, MD  nitroGLYCERIN (NITROSTAT) 0.4 MG SL tablet Place 1 tablet (0.4 mg total) under the tongue every 5 (five) minutes as needed for chest pain. 12/19/12   Rande Brunt, PA-C   rosuvastatin (CRESTOR) 20 MG tablet Take 20 mg by mouth at bedtime.      Historical Provider, MD    FAMILY HISTORY: Brother/sister: CAD Sister: Lung cancer Sister: Diabetes  SOCIAL HISTORY:  reports that he has never smoked. He has never used smokeless tobacco. He reports that he does not drink alcohol or use illicit drugs.  REVIEW OF SYSTEMS:  Constitutional:   No  weight loss, night sweats,  Fevers, chills, fatigue.  HEENT:    No headaches, Difficulty swallowing,Tooth/dental problems,Sore throat,   Cardio-vascular: No  PND, swelling in lower extremities, anasarca  GI:  No heartburn,  loss of appetite  Resp: No shortness of breath with exertion or at rest.  No excess mucus, no productive cough, No non-productive cough  Skin:  no rash or lesions.  GU:  no dysuria, change in color of urine, no urgency or frequency.  No flank pain.  Musculoskeletal: No joint pain or swelling.  No decreased range of motion.  No back pain.  Psych: No change in mood or affect. No depression or anxiety.  No memory loss.   PHYSICAL EXAM: Blood pressure 116/76, pulse 96, temperature 98.4 F (36.9 C), temperature source Oral, resp. rate 19, SpO2 100 %.  General appearance :Awake, alert, not in any distress. Speech Clear. Not toxic Looking HEENT: Atraumatic and Normocephalic, pupils equally reactive to light and accomodation Neck: supple, no JVD. No cervical lymphadenopathy.  Chest:Good air entry bilaterally, no added sounds  CVS: S1 S2 regular, no murmurs.  Abdomen: Bowel sounds present, Non tender and not distended with no gaurding, rigidity or rebound. Extremities: B/L Lower Ext shows no edema, both legs are warm to touch Neurology: Awake alert, and oriented X 3, CN II-XII intact, Non focal Skin:No Rash Wounds:N/A  LABS ON ADMISSION:   Recent Labs  01/14/15 1257 01/14/15 1310  NA 136 136  K 4.9 4.8  CL 98 101  CO2 24  --   GLUCOSE 156* 153*  BUN 52* 52*  CREATININE 3.48*  3.10*  CALCIUM 9.5  --   MG 1.9  --     Recent Labs  01/14/15 1257  AST 19  ALT 11  ALKPHOS 86  BILITOT 0.8  PROT 7.8  ALBUMIN 4.5   No results for input(s): LIPASE, AMYLASE in the last 72 hours.  Recent Labs  01/14/15 1257 01/14/15 1310  WBC 5.2  --   NEUTROABS 2.6  --   HGB 14.5 17.0  HCT 43.7 50.0  MCV 90.1  --   PLT 206  --     Recent Labs  01/14/15 1257  TROPONINI 0.07*   No results for input(s): DDIMER in the last 72 hours. Invalid input(s): POCBNP   RADIOLOGIC STUDIES ON ADMISSION: Dg Chest Portable 1 View  01/14/2015   CLINICAL DATA:  Chest pain.  Initial encounter.  CHF.  Nonsmoker.  EXAM: PORTABLE CHEST - 1 VIEW  COMPARISON:  10/02/2011.  FINDINGS: Cardiopericardial silhouette within normal limits for projection. Mediastinal contours normal. Trachea midline. No airspace disease or effusion. Monitoring leads project over the chest.  IMPRESSION: No active disease.   Electronically Signed   By: Andreas NewportGeoffrey  Lamke M.D.   On: 01/14/2015 13:31     EKG: Independently reviewed. RBBB-ST elevation in inferior leads  ASSESSMENT AND PLAN: Present on Admission:  . Chest pain: Currently chest pain-free, known history of severe CAD with no options for PCI, he has refused CABG in the past, he refused a cardiac cath today. Cardiology recommending medical management. Will start aspirin, statin, beta blocker and Imdur. Cardiology does not recommend heparin infusion at this time. Will check an echocardiogram.   . ARF (acute renal failure): Likely prerenal azotemia given history of nausea with vomiting. Cautiously hydrate, recheck electrolytes in a.m.   . Vomiting/diarrhea: Likely a viral process. Check C. difficile PCR, GI pathogen panel. Start probiotic and other supportive measures.  . CORONARY ATHEROSCLEROSIS NATIVE CORONARY ARTERY: Continue with statin, aspirin, and beta blockers. Rest as above.   . Essential hypertension, benign: Continue Coreg and Imdur. Following  adjust accordingly   . Dyslipidemia: Continue with statin   . Chronic systolic heart failure: clinically compensated-in fact on the dry side of things-will cautiously hydrate given acute renal failure. Would hold diuretics for now.  Further plan will depend as patient's clinical course evolves and further radiologic and laboratory data become available. Patient will be monitored closely.  Above noted plan was discussed with patient,he was in agreement.   DVT Prophylaxis: Prophylactic  Heparin  Code Status: DNR  Disposition Plan: Home when stable   Total time spent for admission equals 45 minutes.  [facility] LaserGHIMIRE,Horris Speros [facility] Pager [phone]  If 7PM-7AM, please contact night-coverage [email or web address] Password [email or web address] 01/14/2015, 2:01 PM

## 2015-01-14 NOTE — Consult Note (Signed)
ADMISSION HISTORY AND PHYSICAL   Date: 01/14/2015               Patient Name:  Maurice Jennings MRN: [id number]  DOB: 03-13-1930 Age / Sex: 81 y.o., male        PCP: HASANAJ,XAJE A Primary Cardiologist: Purvis Sheffield         History of Present Illness: Patient is a 81 y.o. male with a PMHx of severe CAD , who was admitted to [facility] on 01/14/2015 for evaluation of prolonged chest discomfort accompanied by nausea and vomiting.  The patient has a long history of coronary artery disease. I personally reviewed his Films from 2009. At that time he had a critically tight left main stenosis as well as critical LAD, circumflex stenosis. His right coronary artery was chronically and totally occluded and filled by collaterals. The patient is seen by his medical doctor on a periodic basis.  The patient states that he's been having severe chest pain coming by nausea and vomiting for the past day or so. He threw up "a 5 gallon bucket full of water" earlier today. He had severe chest pain and called EMS. They found ST segment elevation in the inferior leads which was new compared to previous EKGs.  He was brought to [facility] for further evaluation and possible code STEMI.  The patient has had intermittent episodes of chest pain for many years.  The pain is midsternal. He takes his medicines on a regular basis. He's not able to read or write because to the pharmacy every week and the pharmacist fills up his pill boxes.  The patient has had severe pain at rest. The pain  typically is better with nitroglycerin. I was unable to tell with the exit took a nitroglycerin this morning. He describes some associated shortness of breath and nausea and vomiting with this chest pain.  EMS was called. They gave him several nitroglycerin as well as morphine. He is completely pain-free at this time.     Medications: Outpatient medications: No current facility-administered medications for this encounter.    Current Outpatient Prescriptions  Medication Sig Dispense Refill  . albuterol (PROVENTIL HFA;VENTOLIN HFA) 108 (90 BASE) MCG/ACT inhaler Inhale 2 puffs into the lungs 4 (four) times daily.    Marland Kitchen albuterol (PROVENTIL) (2.5 MG/3ML) 0.083% nebulizer solution Take 2.5 mg by nebulization 4 (four) times daily.    Marland Kitchen ALPRAZolam (XANAX) 0.5 MG tablet Take 0.5 mg by mouth 2 (two) times daily.      Marland Kitchen amLODipine (NORVASC) 5 MG tablet TAKE ONE TABLET BY MOUTH ONCE DAILY 30 tablet 3  . aspirin 81 MG tablet Take 162 mg by mouth daily.     . carvedilol (COREG) 12.5 MG tablet TAKE 1 AND 1/2 TABLETS TWICE DAILY 45 tablet 3  . clopidogrel (PLAVIX) 75 MG tablet Take 75 mg by mouth daily.      . fish oil-omega-3 fatty acids 1000 MG capsule Take 1 g by mouth 3 (three) times daily.     . furosemide (LASIX) 40 MG tablet Take 40 mg by mouth daily.      Marland Kitchen HYDROcodone-acetaminophen (NORCO/VICODIN) 5-325 MG per tablet Take 1 tablet by mouth every 6 (six) hours as needed for pain.    . isosorbide mononitrate (IMDUR) 120 MG 24 hr tablet Take 2 tablets (240 mg total) by mouth daily. 60 tablet 6  . nitroGLYCERIN (NITROSTAT) 0.4 MG SL tablet Place 1 tablet (0.4 mg total) under the tongue every 5 (  five) minutes as needed for chest pain. 25 tablet 3  . rosuvastatin (CRESTOR) 20 MG tablet Take 20 mg by mouth at bedtime.         Allergies  Allergen Reactions  . Penicillins Swelling    Allergic to injection pcn Patient said he can take the pills     Past Medical History  Diagnosis Date  . Acute on chronic systolic heart failure     stable  . Acute respiratory failure   . Acute myocardial infarction, subendocardial infarction, initial episode of care     the patient declined interventional therapy  . Obstructive chronic bronchitis with exacerbation   . Congestive heart failure, unspecified   . Coronary atherosclerosis of native coronary artery   . Anxiety state, unspecified   . Type II or unspecified type diabetes  mellitus without mention of complication, not stated as uncontrolled   . Alcoholism /alcohol abuse   . GERD (gastroesophageal reflux disease)   . Stable angina     Frequent use of nitroglycerin improved after starting isosorbide mononitrate    Past Surgical History  Procedure Laterality Date  . Hemorroidectomy    . Coronary stent placement      No family history on file.  Social History:  reports that he has never smoked. He has never used smokeless tobacco. He reports that he does not drink alcohol or use illicit drugs.   Review of Systems: Constitutional:  denies fever, chills, diaphoresis, appetite change and fatigue.  HEENT: denies photophobia, eye pain, redness, hearing loss, ear pain, congestion, sore throat, rhinorrhea, sneezing, neck pain, neck stiffness and tinnitus.  Respiratory: denies SOB, DOE, cough, chest tightness, and wheezing.  Cardiovascular: admits to chest pain, palpitations .  Denies  leg swelling.  Gastrointestinal: admits to nausea, vomiting, abdominal pain,.  Has had significant  Diarrhea recently and has been taking immodium , blood in stool in the past   Genitourinary: denies dysuria, urgency, frequency, hematuria, flank pain and difficulty urinating.  Musculoskeletal: denies  myalgias, back pain, joint swelling, arthralgias and gait problem.  He does have gout on occasion.   Skin: denies pallor, rash and wound.  Neurological: denies dizziness, seizures, syncope, weakness, light-headedness, numbness and headaches.   Hematological: denies adenopathy, easy bruising, personal or family bleeding history.  Psychiatric/ Behavioral: denies suicidal ideation, mood changes, confusion, nervousness, sleep disturbance and agitation.    Physical Exam: There were no vitals taken for this visit.  Wt Readings from Last 3 Encounters:  07/05/13 153 lb (69.4 kg)  11/30/12 164 lb 1.9 oz (74.444 kg)  07/25/12 159 lb 12 oz (72.462 kg)    General: Vital signs reviewed and  noted. Elderly  ,appears dissheveled.  Chronically ill   Head: Normocephalic, atraumatic, sclera anicteric, mucus membranes are moist   Neck: Supple. Negative for carotid bruits. JVD not elevated.  Lungs:  Clear bilaterally to auscultation without wheezes, rales, or rhonchi. Breathing is normal   Heart: RRR with S1 S2. No murmurs, rubs, or gallops  Abdomen:  Soft, non-tender, non-distended with normoactive bowel sounds. No hepatomegaly. No rebound/guarding. No obvious abdominal masses  MSK: Strength and the appear normal for age.   Extremities: No clubbing or cyanosis. No edema.  Distal pedal pulses are 2+ and equal bilaterally .  Neurologic: Alert and oriented X 3. Moves all extremities spontaneously   Psych: probably normal     Lab results: Basic Metabolic Panel: No results for input(s): NA, K, CL, CO2, GLUCOSE, BUN, CREATININE, CALCIUM, MG, PHOS in the  last 168 hours.  Liver Function Tests: No results for input(s): AST, ALT, ALKPHOS, BILITOT, PROT, ALBUMIN in the last 168 hours. No results for input(s): LIPASE, AMYLASE in the last 168 hours.  CBC: No results for input(s): WBC, NEUTROABS, HGB, HCT, MCV, PLT in the last 168 hours.  Cardiac Enzymes: No results for input(s): CKTOTAL, CKMB, CKMBINDEX, TROPONINI in the last 168 hours.  BNP: Invalid input(s): POCBNP  CBG: No results for input(s): GLUCAP in the last 168 hours.  Coagulation Studies: No results for input(s): LABPROT, INR in the last 72 hours.   Other results: Personal review of EKG :  NSR , RBBB ( old) , new ST elevation inferiorly   Imaging:  No results found.       Assessment & Plan:  1  CAD:  I have personally reviewed the cath films form 2009.  He had severe CAD with no real options for PCI.  He refused surgery.  He has refused all surgeries and caths since that time and refuses cath today.  He says that he is too old.   His ECG shows ST elevation in the inf. Leads but his RCA has been occluded for  years. He is refusing cath at present. Will treat medically Currently his is pain free.  He has been on Imdur in the past and may benefit from restarting. i did not find it in his medication bag.  2. Acute renal insufficeincy.  He had N/V this am. I have discussed with Int. Med. And they will take him on their service.  He may need additional work up - although he is refusing anything currently.    DVT PPX -    Alvia Grove., MD, [facility] 01/14/2015, 12:57 PM

## 2015-01-14 NOTE — ED Notes (Signed)
[facility] for c/o chest pain, weakness, and SOB.  Pt with cardiac hx.  Pt reports associated N/V.  Pt currently pain free.  Pt received 2 NTG, 4 baby ASA, 2mg  MS, 4mg  Zofran en route.  BP-117/69 R-20 P-91.  Per EMS, pt lives alone and neighbor checked in on him since he had not been outside in a couple days.  Pt A&O x4.

## 2015-01-15 ENCOUNTER — Inpatient Hospital Stay (HOSPITAL_COMMUNITY): Payer: Medicare Other

## 2015-01-15 DIAGNOSIS — R079 Chest pain, unspecified: Secondary | ICD-10-CM

## 2015-01-15 DIAGNOSIS — R110 Nausea: Secondary | ICD-10-CM

## 2015-01-15 DIAGNOSIS — E785 Hyperlipidemia, unspecified: Secondary | ICD-10-CM

## 2015-01-15 DIAGNOSIS — I5022 Chronic systolic (congestive) heart failure: Secondary | ICD-10-CM

## 2015-01-15 DIAGNOSIS — I255 Ischemic cardiomyopathy: Secondary | ICD-10-CM

## 2015-01-15 DIAGNOSIS — I200 Unstable angina: Secondary | ICD-10-CM

## 2015-01-15 LAB — BASIC METABOLIC PANEL
ANION GAP: 10 (ref 5–15)
BUN: 65 mg/dL — AB (ref 6–23)
CO2: 23 mmol/L (ref 19–32)
CREATININE: 3.51 mg/dL — AB (ref 0.50–1.35)
Calcium: 8.4 mg/dL (ref 8.4–10.5)
Chloride: 102 mmol/L (ref 96–112)
GFR calc Af Amer: 17 mL/min — ABNORMAL LOW (ref >90)
GFR calc non Af Amer: 15 mL/min — ABNORMAL LOW (ref >90)
GLUCOSE: 100 mg/dL — AB (ref 70–99)
Potassium: 4.6 mmol/L (ref 3.5–5.1)
SODIUM: 135 mmol/L (ref 135–145)

## 2015-01-15 LAB — GLUCOSE, CAPILLARY: Glucose-Capillary: 87 mg/dL (ref 70–99)

## 2015-01-15 LAB — CBC
HCT: 36.9 % — ABNORMAL LOW (ref 39.0–52.0)
HEMOGLOBIN: 12.2 g/dL — AB (ref 13.0–17.0)
MCH: 29.8 pg (ref 26.0–34.0)
MCHC: 33.1 g/dL (ref 30.0–36.0)
MCV: 90.2 fL (ref 78.0–100.0)
Platelets: 177 10*3/uL (ref 150–400)
RBC: 4.09 MIL/uL — ABNORMAL LOW (ref 4.22–5.81)
RDW: 14.3 % (ref 11.5–15.5)
WBC: 4.9 10*3/uL (ref 4.0–10.5)

## 2015-01-15 LAB — CLOSTRIDIUM DIFFICILE BY PCR: Toxigenic C. Difficile by PCR: NEGATIVE

## 2015-01-15 LAB — TROPONIN I: TROPONIN I: 0.07 ng/mL — AB (ref <0.031)

## 2015-01-15 MED ORDER — INSULIN ASPART 100 UNIT/ML ~~LOC~~ SOLN: 0.0000 [IU] | Freq: Three times a day (TID) | SUBCUTANEOUS | Status: DC

## 2015-01-15 MED ORDER — INSULIN ASPART 100 UNIT/ML ~~LOC~~ SOLN: 0.0000 [IU] | Freq: Every day | SUBCUTANEOUS | Status: DC

## 2015-01-15 NOTE — Progress Notes (Signed)
Echocardiogram 2D Echocardiogram has been performed.  Dorothey BasemanReel, [place]ajra [place] M 01/15/2015, 8:26 AM

## 2015-01-15 NOTE — Progress Notes (Signed)
[facility] PROGRESS NOTE  Maurice AlbeeCharlie D Jennings OZH:[id number] DOB: Oct 05, 1930 DOA: 01/14/2015 PCP: Maurice DeitersHASANAJ,XAJE A, MD  Assessment/Plan: 1. ARF 1. Cr is worsening which is concerning 2. Will order NS at 75cc/hr 3. Avoid nephrotoxic agents 4. Follow renal function 2. N/V 1. Suspect infectious gastroenteritis as pt reports sx started shortly after consuming home-made soup on the same day as onset 2. Cont with IVF per above 3. Continue supportive Care 4. Pt remains nauseated today 3. Essential HTN 1. bp stable 2. Continue to monitor 4. HLD 1. Continue statin 5. Chronic systolic CHF 1. Ef of 45% 2. Clinically dehydrated currently as pt complains of thirst and has decreased skin turgor 3. IVF cautiously per above 6. Chest pain 1. Cardiology consulted 2. Pt declines aggressive cardiac w/u 3. Medical mgt for now 7. DM2 1. Glucose elevated overnight 2. Will add SSI coverage 8. DVT prophylaxis 1. Heparin subQ  Code Status: DNR Family Communication: Pt in room (indicate person spoken with, relationship, and if by phone, the number) Disposition Plan: pending   Consultants:  Cardiology  Procedures:  none  Antibiotics:  none (indicate start date, and stop date if known)  HPI/Subjective: Complains of continued diarrhea and nausea. Feels very weak  Objective: Filed Vitals:   01/14/15 2158 01/15/15 0158 01/15/15 0442 01/15/15 0900  BP: 126/86 124/82 128/74 107/56  Pulse: 90 86 82 78  Temp: 97.4 F (36.3 C) 97.6 F (36.4 C) 97.5 F (36.4 C) 98.8 F (37.1 C)  TempSrc: Oral Oral Oral Tympanic  Resp: 16 16 16 16   Height:      Weight:   58.931 kg (129 lb 14.7 oz)   SpO2: 100% 100% 100% 96%    Intake/Output Summary (Last 24 hours) at 01/15/15 1443 Last data filed at 01/15/15 0920  Gross per 24 hour  Intake    360 ml  Output      0 ml  Net    360 ml   Filed Weights   01/14/15 1516 01/15/15 0442  Weight: 59.8 kg (131 lb 13.4 oz) 58.931 kg (129 lb 14.7 oz)     Exam:   General:  Awake, in nad  Cardiovascular: regular, s1, s2  Respiratory: normal resp effort, no wheezing  Abdomen: soft, pos BS  Musculoskeletal: perfused, no clubbing   Data Reviewed: Basic Metabolic Panel:  Recent Labs Lab 01/14/15 1257 01/14/15 1310 01/14/15 1600 01/15/15 0310  NA 136 136  --  135  Jennings 4.9 4.8  --  4.6  CL 98 101  --  102  CO2 24  --   --  23  GLUCOSE 156* 153*  --  100*  BUN 52* 52*  --  65*  CREATININE 3.48* 3.10* 3.46* 3.51*  CALCIUM 9.5  --   --  8.4  MG 1.9  --   --   --    Liver Function Tests:  Recent Labs Lab 01/14/15 1257  AST 19  ALT 11  ALKPHOS 86  BILITOT 0.8  PROT 7.8  ALBUMIN 4.5   No results for input(s): LIPASE, AMYLASE in the last 168 hours. No results for input(s): AMMONIA in the last 168 hours. CBC:  Recent Labs Lab 01/14/15 1257 01/14/15 1310 01/14/15 1600 01/15/15 0310  WBC 5.2  --  5.1 4.9  NEUTROABS 2.6  --   --   --   HGB 14.5 17.0 15.4 12.2*  HCT 43.7 50.0 45.9 36.9*  MCV 90.1  --  92.2 90.2  PLT 206  --  194 177   Cardiac Enzymes:  Recent Labs Lab 01/14/15 1257 01/14/15 1600 01/14/15 2110 01/15/15 0310  TROPONINI 0.07* 0.07* 0.09* 0.07*   BNP (last 3 results) No results for input(s): BNP in the last 8760 hours.  ProBNP (last 3 results) No results for input(s): PROBNP in the last 8760 hours.  CBG: No results for input(s): GLUCAP in the last 168 hours.  No results found for this or any previous visit (from the past 240 hour(s)).   Studies: Dg Chest Portable 1 View  01/14/2015   CLINICAL DATA:  Chest pain.  Initial encounter.  CHF.  Nonsmoker.  EXAM: PORTABLE CHEST - 1 VIEW  COMPARISON:  10/02/2011.  FINDINGS: Cardiopericardial silhouette within normal limits for projection. Mediastinal contours normal. Trachea midline. No airspace disease or effusion. Monitoring leads project over the chest.  IMPRESSION: No active disease.   Electronically Signed   By: Andreas Newport M.D.   On:  01/14/2015 13:31   Dg Abd Portable 1v  01/15/2015   CLINICAL DATA:  Abdominal pain.  EXAM: PORTABLE ABDOMEN - 1 VIEW  COMPARISON:  Scout image for CT scan dated 02/09/2010  FINDINGS: There are no dilated loops of large or small bowel. There is air scattered throughout the nondistended colon. Moderate air in the stomach.  No visible free air or free fluid on the supine radiograph. No acute osseous abnormality.  IMPRESSION: Benign appearing abdomen.   Electronically Signed   By: Geanie Cooley M.D.   On: 01/15/2015 13:30    Scheduled Meds: . aspirin EC  325 mg Oral Daily  . atorvastatin  20 mg Oral q1800  . carvedilol  6.25 mg Oral BID WC  . heparin  5,000 Units Subcutaneous 3 times per day  . isosorbide mononitrate  30 mg Oral Daily  . saccharomyces boulardii  250 mg Oral BID  . sodium chloride  3 mL Intravenous Q12H   Continuous Infusions: . sodium chloride 75 mL/hr at 01/15/15 0744    Principal Problem:   Unstable angina Active Problems:   Dyslipidemia   CORONARY ATHEROSCLEROSIS NATIVE CORONARY ARTERY   Cardiomyopathy, ischemic   Chronic systolic heart failure   Essential hypertension, benign   Diabetes mellitus   ARF (acute renal failure)   Maurice Jennings  [facility] Pager [phone]. If 7PM-7AM, please contact night-coverage at [email or web address], password [facility] 01/15/2015, 2:43 PM  LOS: 1 day

## 2015-01-15 NOTE — Progress Notes (Signed)
Patient Name: Maurice Jennings Date of Encounter: 01/15/2015     Principal Problem:   Unstable angina Active Problems:   CORONARY ATHEROSCLEROSIS NATIVE CORONARY ARTERY   ARF (acute renal failure)   Cardiomyopathy, ischemic   Chronic systolic heart failure   Essential hypertension, benign   Diabetes mellitus   Dyslipidemia    SUBJECTIVE  Says he's feeling rough.  Significant abdominal pain and tenderness along with chest and bilat arm pain - "i just hurt all over."  No dyspnea.  No further vomiting but says that over the past few days he's been throwing up buckets full of dark emesis with dark stools as well.  CURRENT MEDS . aspirin EC  325 mg Oral Daily  . atorvastatin  20 mg Oral q1800  . carvedilol  6.25 mg Oral BID WC  . heparin  5,000 Units Subcutaneous 3 times per day  . isosorbide mononitrate  30 mg Oral Daily  . saccharomyces boulardii  250 mg Oral BID  . sodium chloride  3 mL Intravenous Q12H    OBJECTIVE  Filed Vitals:   01/14/15 2158 01/15/15 0158 01/15/15 0442 01/15/15 0900  BP: 126/86 124/82 128/74 107/56  Pulse: 90 86 82 78  Temp: 97.4 F (36.3 C) 97.6 F (36.4 C) 97.5 F (36.4 C) 98.8 F (37.1 C)  TempSrc: Oral Oral Oral Tympanic  Resp: Height:      Weight:   129 lb 14.7 oz (58.931 kg)   SpO2: 100% 100% 100% 96%    Intake/Output Summary (Last 24 hours) at 01/15/15 1131 Last data filed at 01/15/15 0920  Gross per 24 hour  Intake    360 ml  Output      0 ml  Net    360 ml   Filed Weights   01/14/15 1516 01/15/15 0442  Weight: 131 lb 13.4 oz (59.8 kg) 129 lb 14.7 oz (58.931 kg)    PHYSICAL EXAM  General: Pleasant, NAD. Neuro: Alert and oriented X 3. Moves all extremities spontaneously. Psych: Flat affect. HEENT:  Normal  Neck: Supple without bruits or JVD. Lungs:  Resp regular and unlabored, CTA. Heart: RRR no s3, s4, or murmurs. Abdomen: distended, tender to slight palpation over entire abd, BS + x 4.  Extremities: No  clubbing, cyanosis or edema. DP/PT/Radials 2+ and equal bilaterally.  Accessory Clinical Findings  CBC  Recent Labs  01/14/15 1257  01/14/15 1600 01/15/15 0310  WBC 5.2  --  5.1 4.9  NEUTROABS 2.6  --   --   --   HGB 14.5  < > 15.4 12.2*  HCT 43.7  < > 45.9 36.9*  MCV 90.1  --  92.2 90.2  PLT 206  --  194 177  < > = values in this interval not displayed. Basic Metabolic Panel  Recent Labs  01/14/15 1257 01/14/15 1310 01/14/15 1600 01/15/15 0310  NA 136 136  --  135  K 4.9 4.8  --  4.6  CL 98 101  --  102  CO2 24  --   --  23  GLUCOSE 156* 153*  --  100*  BUN 52* 52*  --  65*  CREATININE 3.48* 3.10* 3.46* 3.51*  CALCIUM 9.5  --   --  8.4  MG 1.9  --   --   --    Liver Function Tests  Recent Labs  01/14/15 1257  AST 19  ALT 11  ALKPHOS 86  BILITOT 0.8  PROT  7.8  ALBUMIN 4.5   Cardiac Enzymes  Recent Labs  01/14/15 1600 01/14/15 2110 01/15/15 0310  TROPONINI 0.07* 0.09* 0.07*    TELE  RSR, 70's  ECG  Rsr, 93, rbbb, lad, inf/antlat infarcts, mild inf st elev.  Radiology/Studies  Dg Chest Portable 1 View  01/14/2015   CLINICAL DATA:  Chest pain.  Initial encounter.  CHF.  Nonsmoker.  EXAM: PORTABLE CHEST - 1 VIEW  COMPARISON:  10/02/2011.  FINDINGS: Cardiopericardial silhouette within normal limits for projection. Mediastinal contours normal. Trachea midline. No airspace disease or effusion. Monitoring leads project over the chest.  IMPRESSION: No active disease.   Electronically Signed   By: Andreas NewportGeoffrey  Lamke M.D.   On: 01/14/2015 13:31   ASSESSMENT AND PLAN  1.  USA/Elevated troponin/CAD:  Pt presented yesterday with c/p and mild trop elevation.  Troponin trend has remained flat.  Per Dr. Harvie BridgeNahser's note yesterday, cath films from 2009 reviewed and notable for critical LM, LAD, and LCX stenoses with CTO of the RCA.  He has prev refused CABG or additional caths/interventions and is not interested in any further w/u at this time.  Cont conservative mgmt  with asa, statin, bb, nitrate.  2.  Chronic systolic CHF/ICM: EF 35-40% by echo in 2013.  Euvolemic on exam. Echo performed this AM and result pending.  Cont bb and nitrate.  BP 107 this am - may not tolerate additional afterload reduction w/ hydralazine.  No acei/arb/spiro/arni 2/2 acute on presumably chronic renal failure.  3.  Acute renal failure:  Presumed to be prerenal in setting of recent n/v/d.  Creat not any better following gentle hydration.  Per IM.  4. Abdominal pain: Associated w/ n/v/d along with dark stools and emesis.  H/H down this morning but was nl on admission.  Guaiac stools/emesis if not already ordered.  Will order KUB but will likely require additional abdominal imaging as well - defer to IM.  5.  HTN:  Stable on bb.  6.  HL: Cont statin.  F/U lipids.  lft's wnl.  Signed, Nicolasa Duckinghristopher Berge NP   Patient seen and examined. Agree with assessment and plan. No chest pain. No shortness of breath. Minimal troponin elevation in a plateau pattern, doubt ACS.  For echo today.   Lennette Biharihomas A. Kelly, MD, [facility] Center[facility] 01/15/2015 12:23 PM

## 2015-01-15 NOTE — Progress Notes (Deleted)
Echocardiogram 2D Echocardiogram has been performed.  Shayna Eblen M 01/15/2015, 8:26 AM 

## 2015-01-15 NOTE — Care Management Note (Signed)
    Page 1 of 1   01/17/2015     3:51:11 PM CARE MANAGEMENT NOTE 01/17/2015  Patient:  Maurice Jennings,[name]   Account Number:  [id number]  Date Initiated:  01/15/2015  Documentation initiated by:  Elmer BalesOBARGE,COURTNEY  Subjective/Objective Assessment:   Patient was admitted with n/v/d, chest pain, acute renal failure.  Lives independently     Action/Plan:   Will follow for discharge needs pending PT/OT evals and physician orders.   Anticipated DC Date:  01/17/2015   Anticipated DC Plan:  HOME/SELF CARE      DC Planning Services  CM consult      Choice offered to / List presented to:             Status of service:  Completed, signed off Medicare Important Message given?  YES (If response is "NO", the following Medicare IM given date fields will be blank) Date Medicare IM given:  01/17/2015 Medicare IM given by:  Camdyn Laden Date Additional Medicare IM given:   Additional Medicare IM given by:    Discharge Disposition:  HOME/SELF CARE  Per UR Regulation:  Reviewed for med. necessity/level of care/duration of stay  If discussed at Long Length of Stay Meetings, dates discussed:    Comments:

## 2015-01-15 NOTE — ED Provider Notes (Signed)
CSN: [id number]     Arrival date & time 01/14/15  1238 History   First MD Initiated Contact with Patient 01/14/15 1247     Chief Complaint  Patient presents with  . Chest Pain  . Weakness  . Shortness of Breath     (Consider location/radiation/quality/duration/timing/severity/associated sxs/prior Treatment) Patient is a 81 y.o. male presenting with chest pain, weakness, and shortness of breath. The history is provided by the patient.  Chest Pain Pain location:  Substernal area Associated symptoms: fatigue, nausea, shortness of breath, vomiting and weakness   Associated symptoms: no abdominal pain, no back pain, no headache and no numbness   Weakness Associated symptoms include chest pain and shortness of breath. Pertinent negatives include no abdominal pain and no headaches.  Shortness of Breath Associated symptoms: chest pain and vomiting   Associated symptoms: no abdominal pain, no headaches and no rash    patient presents with substernal chest pain for the last few days. Initial EKG shows ST elevation. Patient has known severe disease that is not amenable to cath and patient has refused a CABG for years. He states he still wants that. Came in as a code STEMI. No fevers. States he's had some diarrhea. Seen immediately by myself and cardiology. Code STEMI canceled since he was not going to go to the Cath Lab. No heparin. no fevers. No abdominal pain. States she's felt weak last few days. Patient's pain is now resolved after nitroglycerin and morphine.  Past Medical History  Diagnosis Date  . Acute on chronic systolic heart failure     stable  . Acute respiratory failure   . Acute myocardial infarction, subendocardial infarction, initial episode of care     the patient declined interventional therapy  . Obstructive chronic bronchitis with exacerbation   . Congestive heart failure, unspecified   . Coronary atherosclerosis of native coronary artery   . Anxiety state, unspecified   .  Type II or unspecified type diabetes mellitus without mention of complication, not stated as uncontrolled   . Alcoholism /alcohol abuse   . GERD (gastroesophageal reflux disease)   . Stable angina     Frequent use of nitroglycerin improved after starting isosorbide mononitrate  . Hypertension    Past Surgical History  Procedure Laterality Date  . Hemorroidectomy    . Coronary stent placement     No family history on file. History  Substance Use Topics  . Smoking status: Never Smoker   . Smokeless tobacco: Never Used  . Alcohol Use: No     Comment: History of Alcohol use    Review of Systems  Constitutional: Positive for fatigue. Negative for activity change and appetite change.  Eyes: Negative for pain.  Respiratory: Positive for shortness of breath. Negative for chest tightness.   Cardiovascular: Positive for chest pain. Negative for leg swelling.  Gastrointestinal: Positive for nausea and vomiting. Negative for abdominal pain and diarrhea.  Genitourinary: Negative for flank pain.  Musculoskeletal: Negative for back pain and neck stiffness.  Skin: Negative for rash.  Neurological: Positive for weakness. Negative for numbness and headaches.  Psychiatric/Behavioral: Negative for behavioral problems.      Allergies  Penicillins  Home Medications   Prior to Admission medications   Medication Sig Start Date End Date Taking? Authorizing Provider  albuterol (PROVENTIL HFA;VENTOLIN HFA) 108 (90 BASE) MCG/ACT inhaler Inhale 2 puffs into the lungs 4 (four) times daily.    Historical Provider, MD  albuterol (PROVENTIL) (2.5 MG/3ML) 0.083% nebulizer solution Take 2.5 mg  by nebulization 4 (four) times daily.    Historical Provider, MD  ALPRAZolam Prudy Feeler) 0.5 MG tablet Take 0.5 mg by mouth 2 (two) times daily.      Historical Provider, MD  amLODipine (NORVASC) 5 MG tablet TAKE ONE TABLET BY MOUTH ONCE DAILY 07/25/12   June Leap, MD  aspirin 81 MG tablet Take 162 mg by mouth  daily.     Historical Provider, MD  carvedilol (COREG) 12.5 MG tablet TAKE 1 AND 1/2 TABLETS TWICE DAILY 07/25/12   June Leap, MD  clopidogrel (PLAVIX) 75 MG tablet Take 75 mg by mouth daily.      Historical Provider, MD  fish oil-omega-3 fatty acids 1000 MG capsule Take 1 g by mouth 3 (three) times daily.     Historical Provider, MD  furosemide (LASIX) 40 MG tablet Take 40 mg by mouth daily.      Historical Provider, MD  HYDROcodone-acetaminophen (NORCO/VICODIN) 5-325 MG per tablet Take 1 tablet by mouth every 6 (six) hours as needed for pain.    Historical Provider, MD  isosorbide mononitrate (IMDUR) 120 MG 24 hr tablet Take 2 tablets (240 mg total) by mouth daily. 07/19/13   Laqueta Linden, MD  nitroGLYCERIN (NITROSTAT) 0.4 MG SL tablet Place 1 tablet (0.4 mg total) under the tongue every 5 (five) minutes as needed for chest pain. 12/19/12   Rande Brunt, PA-C  rosuvastatin (CRESTOR) 20 MG tablet Take 20 mg by mouth at bedtime.      Historical Provider, MD   BP 128/74 mmHg  Pulse 82  Temp(Src) 97.5 F (36.4 C) (Oral)  Resp 16  Ht  (1.6 m)  Wt 129 lb 14.7 oz (58.931 kg)  BMI 23.02 kg/m2  SpO2 100% Physical Exam  Constitutional: He is oriented to person, place, and time. He appears well-developed and well-nourished.  HENT:  Head: Normocephalic and atraumatic.  Eyes: Pupils are equal, round, and reactive to light.  Neck: No JVD present.  Cardiovascular: Normal rate, regular rhythm and normal heart sounds.   No murmur heard. Pulmonary/Chest: Effort normal and breath sounds normal.  Abdominal: Soft. Bowel sounds are normal. He exhibits no distension and no mass. There is no tenderness. There is no rebound and no guarding.  Musculoskeletal: Normal range of motion. He exhibits no edema.  Neurological: He is alert and oriented to person, place, and time. No cranial nerve deficit.  Skin: Skin is warm and dry.  Psychiatric: He has a normal mood and affect.  Nursing note and  vitals reviewed.   ED Course  Procedures (including critical care time) Labs Review Labs Reviewed  TROPONIN I - Abnormal; Notable for the following:    Troponin I 0.07 (*)    All other components within normal limits  CBC WITH DIFFERENTIAL/PLATELET - Abnormal; Notable for the following:    Monocytes Relative 19 (*)    All other components within normal limits  COMPREHENSIVE METABOLIC PANEL - Abnormal; Notable for the following:    Glucose, Bld 156 (*)    BUN 52 (*)    Creatinine, Ser 3.48 (*)    GFR calc non Af Amer 15 (*)    GFR calc Af Amer 17 (*)    All other components within normal limits  CREATININE, SERUM - Abnormal; Notable for the following:    Creatinine, Ser 3.46 (*)    GFR calc non Af Amer 15 (*)    GFR calc Af Amer 18 (*)    All  other components within normal limits  TROPONIN I - Abnormal; Notable for the following:    Troponin I 0.07 (*)    All other components within normal limits  TROPONIN I - Abnormal; Notable for the following:    Troponin I 0.09 (*)    All other components within normal limits  TROPONIN I - Abnormal; Notable for the following:    Troponin I 0.07 (*)    All other components within normal limits  BASIC METABOLIC PANEL - Abnormal; Notable for the following:    Glucose, Bld 100 (*)    BUN 65 (*)    Creatinine, Ser 3.51 (*)    GFR calc non Af Amer 15 (*)    GFR calc Af Amer 17 (*)    All other components within normal limits  I-STAT CHEM 8, ED - Abnormal; Notable for the following:    BUN 52 (*)    Creatinine, Ser 3.10 (*)    Glucose, Bld 153 (*)    Calcium, Ion 1.12 (*)    All other components within normal limits  CLOSTRIDIUM DIFFICILE BY PCR  MAGNESIUM  PROTIME-INR  APTT  CBC  GI PATHOGEN PANEL BY PCR, STOOL  URINALYSIS, ROUTINE W REFLEX MICROSCOPIC  CBC  I-STAT TROPOININ, ED    Imaging Review Dg Chest Portable 1 View  01/14/2015   CLINICAL DATA:  Chest pain.  Initial encounter.  CHF.  Nonsmoker.  EXAM: PORTABLE CHEST - 1  VIEW  COMPARISON:  10/02/2011.  FINDINGS: Cardiopericardial silhouette within normal limits for projection. Mediastinal contours normal. Trachea midline. No airspace disease or effusion. Monitoring leads project over the chest.  IMPRESSION: No active disease.   Electronically Signed   By: Andreas NewportGeoffrey  Lamke M.D.   On: 01/14/2015 13:31     EKG Interpretation   Date/Time:  Tuesday January 14 2015 12:43:54 EST Ventricular Rate:  93 PR Interval:  184 QRS Duration: 137 QT Interval:  401 QTC Calculation: 499 R Axis:   -79 Text Interpretation:  Sinus tachycardia Multiform ventricular premature  complexes Right bundle branch block LVH with IVCD and secondary repol  abnrm Inferior infarct, acute (RCA) Anterolateral infarct, age  indeterminate Probable RV involvement, suggest recording right precordial  leads Confirmed by Marinda Tyer  MD, Harrold DonathNATHAN [phone]([id number]) on 01/14/2015 12:48:54 PM      MDM   Final diagnoses:  Chest pain, unspecified chest pain type  Renal insufficiency    Patient came in as a code STEMI. ST elevation on EKG but patient is pain-free and does not want either CABG and is not able to be stented. Code STEMI canceled. No heparin per cardiology. Patient does have a negative troponin and an elevated creatinine. Will admit to internal medicine.   Juliet RudeNathan R. Rubin PayorPickering, MD 01/15/15 [phone]

## 2015-01-15 NOTE — Progress Notes (Signed)
UR complete.  Loren Vicens RN, MSN 

## 2015-01-16 DIAGNOSIS — K5289 Other specified noninfective gastroenteritis and colitis: Secondary | ICD-10-CM | POA: Insufficient documentation

## 2015-01-16 LAB — URINALYSIS, ROUTINE W REFLEX MICROSCOPIC
Bilirubin Urine: NEGATIVE
GLUCOSE, UA: NEGATIVE mg/dL
Hgb urine dipstick: NEGATIVE
KETONES UR: NEGATIVE mg/dL
Leukocytes, UA: NEGATIVE
NITRITE: NEGATIVE
Protein, ur: NEGATIVE mg/dL
SPECIFIC GRAVITY, URINE: 1.015 (ref 1.005–1.030)
Urobilinogen, UA: 0.2 mg/dL (ref 0.0–1.0)
pH: 5 (ref 5.0–8.0)

## 2015-01-16 LAB — CBC
HEMATOCRIT: 34 % — AB (ref 39.0–52.0)
Hemoglobin: 11 g/dL — ABNORMAL LOW (ref 13.0–17.0)
MCH: 30 pg (ref 26.0–34.0)
MCHC: 32.4 g/dL (ref 30.0–36.0)
MCV: 92.6 fL (ref 78.0–100.0)
PLATELETS: 173 10*3/uL (ref 150–400)
RBC: 3.67 MIL/uL — ABNORMAL LOW (ref 4.22–5.81)
RDW: 14.3 % (ref 11.5–15.5)
WBC: 5.6 10*3/uL (ref 4.0–10.5)

## 2015-01-16 LAB — BASIC METABOLIC PANEL
Anion gap: 9 (ref 5–15)
BUN: 56 mg/dL — ABNORMAL HIGH (ref 6–23)
CALCIUM: 8.5 mg/dL (ref 8.4–10.5)
CO2: 20 mmol/L (ref 19–32)
Chloride: 109 mmol/L (ref 96–112)
Creatinine, Ser: 2.16 mg/dL — ABNORMAL HIGH (ref 0.50–1.35)
GFR calc Af Amer: 31 mL/min — ABNORMAL LOW (ref >90)
GFR calc non Af Amer: 27 mL/min — ABNORMAL LOW (ref >90)
Glucose, Bld: 86 mg/dL (ref 70–99)
Potassium: 4.6 mmol/L (ref 3.5–5.1)
Sodium: 138 mmol/L (ref 135–145)

## 2015-01-16 LAB — GLUCOSE, CAPILLARY
GLUCOSE-CAPILLARY: 98 mg/dL (ref 70–99)
GLUCOSE-CAPILLARY: 84 mg/dL (ref 70–99)
GLUCOSE-CAPILLARY: 89 mg/dL (ref 70–99)
Glucose-Capillary: 100 mg/dL — ABNORMAL HIGH (ref 70–99)

## 2015-01-16 NOTE — Progress Notes (Signed)
[facility] PROGRESS NOTE  DREUX MCGROARTY ZOX:[id number] DOB: July 17, 1930 DOA: 01/14/2015 PCP: Toma Deiters, MD  Assessment/Plan: 1. ARF 1. Cr initially worsened, but has now shown improvement with IV hydration 2. Continue to avoid nephrotoxic agents 3. Continue to follow renal function 2. N/V 1. Suspect infectious gastroenteritis as pt reports sx started shortly after consuming home-made soup made with deer meat on the same day as onset 2. Now improving 3. Cont with IVF per above 4. Continue supportive Care 3. Essential HTN 1. bp stable 2. Continue to monitor 4. HLD 1. Continue statin 2. AM lipids ordered per Cardiology 5. Chronic systolic CHF 1. Ef of 45% 2. Clinically dehydrated currently as pt complains of thirst and has decreased skin turgor 3. IVF cautiously per above 6. Chest pain 1. Cardiology consulted 2. Pt has declined aggressive cardiac w/u 3. Continue medical mgt for now 7. DM2 1. Glucose initially elevated, now improved with addition of SSI coverage 8. DVT prophylaxis 1. Heparin subQ  Code Status: DNR Family Communication: Pt in room Disposition Plan: Possible home tomorrow if renal function improves and patient tolerates PO   Consultants:  Cardiology  Procedures:  none  Antibiotics:  none (indicate start date, and stop date if known)  HPI/Subjective: Reports feeling better. Eager to go home  Objective: Filed Vitals:   01/16/15 0516 01/16/15 0700 01/16/15 0900 01/16/15 1357  BP: 117/55 111/53 107/54 101/53  Pulse: 63 66 63 64  Temp: 98.9 F (37.2 C) 97.3 F (36.3 C)  97.6 F (36.4 C)  TempSrc: Oral Oral  Oral  Resp: Height:      Weight: 61 kg (134 lb 7.7 oz)     SpO2: 96% 99% 98% 96%    Intake/Output Summary (Last 24 hours) at 01/16/15 1658 Last data filed at 01/16/15 1400  Gross per 24 hour  Intake 3007.33 ml  Output   1000 ml  Net 2007.33 ml   Filed Weights   01/14/15 1516 01/15/15 0442 01/16/15 0516   Weight: 59.8 kg (131 lb 13.4 oz) 58.931 kg (129 lb 14.7 oz) 61 kg (134 lb 7.7 oz)    Exam:   General:  Awake, in nad  Cardiovascular: regular, s1, s2  Respiratory: normal resp effort, no wheezing  Abdomen: soft, pos BS  Musculoskeletal: perfused, no clubbing   Data Reviewed: Basic Metabolic Panel:  Recent Labs Lab 01/14/15 1257 01/14/15 1310 01/14/15 1600 01/15/15 0310 01/16/15 0426  NA 136 136  --  135 138  K 4.9 4.8  --  4.6 4.6  CL 98 101  --  102 109  CO2 24  --   --  23 20  GLUCOSE 156* 153*  --  100* 86  BUN 52* 52*  --  65* 56*  CREATININE 3.48* 3.10* 3.46* 3.51* 2.16*  CALCIUM 9.5  --   --  8.4 8.5  MG 1.9  --   --   --   --    Liver Function Tests:  Recent Labs Lab 01/14/15 1257  AST 19  ALT 11  ALKPHOS 86  BILITOT 0.8  PROT 7.8  ALBUMIN 4.5   No results for input(s): LIPASE, AMYLASE in the last 168 hours. No results for input(s): AMMONIA in the last 168 hours. CBC:  Recent Labs Lab 01/14/15 1257 01/14/15 1310 01/14/15 1600 01/15/15 0310 01/16/15 0426  WBC 5.2  --  5.1 4.9 5.6  NEUTROABS 2.6  --   --   --   --  HGB 14.5 17.0 15.4 12.2* 11.0*  HCT 43.7 50.0 45.9 36.9* 34.0*  MCV 90.1  --  92.2 90.2 92.6  PLT 206  --  194 177 173   Cardiac Enzymes:  Recent Labs Lab 01/14/15 1257 01/14/15 1600 01/14/15 2110 01/15/15 0310  TROPONINI 0.07* 0.07* 0.09* 0.07*   BNP (last 3 results) No results for input(s): BNP in the last 8760 hours.  ProBNP (last 3 results) No results for input(s): PROBNP in the last 8760 hours.  CBG:  Recent Labs Lab 01/15/15 2211 01/16/15 0545 01/16/15 1255  GLUCAP 87 98 100*    Recent Results (from the past 240 hour(s))  Clostridium Difficile by PCR     Status: None   Collection Time: 01/15/15  3:58 PM  Result Value Ref Range Status   C difficile by pcr NEGATIVE NEGATIVE Final     Studies: Dg Abd Portable 1v  01/15/2015   CLINICAL DATA:  Abdominal pain.  EXAM: PORTABLE ABDOMEN - 1 VIEW   COMPARISON:  Scout image for CT scan dated 02/09/2010  FINDINGS: There are no dilated loops of large or small bowel. There is air scattered throughout the nondistended colon. Moderate air in the stomach.  No visible free air or free fluid on the supine radiograph. No acute osseous abnormality.  IMPRESSION: Benign appearing abdomen.   Electronically Signed   By: Geanie CooleyJim  Maxwell M.D.   On: 01/15/2015 13:30    Scheduled Meds: . aspirin EC  325 mg Oral Daily  . atorvastatin  20 mg Oral q1800  . carvedilol  6.25 mg Oral BID WC  . insulin aspart  0-15 Units Subcutaneous TID WC  . insulin aspart  0-5 Units Subcutaneous QHS  . isosorbide mononitrate  30 mg Oral Daily  . saccharomyces boulardii  250 mg Oral BID  . sodium chloride  3 mL Intravenous Q12H   Continuous Infusions: . sodium chloride 50 mL/hr at 01/16/15 1036    Principal Problem:   Unstable angina Active Problems:   Dyslipidemia   CORONARY ATHEROSCLEROSIS NATIVE CORONARY ARTERY   Cardiomyopathy, ischemic   Chronic systolic heart failure   Essential hypertension, benign   Diabetes mellitus   ARF (acute renal failure)   Pain in the chest   Nausea   CHIU, STEPHEN K  [facility] Pager [phone]. If 7PM-7AM, please contact night-coverage at [email or web address], password [email or web address] 01/16/2015, 4:58 PM  LOS: 2 days

## 2015-01-16 NOTE — Progress Notes (Signed)
Patient Profile: Patient is a 81 y.o. male with a PMHx of severe CAD , who was [facility] for evaluation of prolonged chest discomfort accompanied by nausea and vomiting. Ruled in with elevated troponin.  Subjective: No complaints. He denies further CP. No dyspnea. Abdominal pain also resolved. He had a small BM this morning. He denies further melena.   Objective: Vital signs in last 24 hours: Temp:  [97.3 F (36.3 C)-99.2 F (37.3 C)] 97.3 F (36.3 C) (02/04 0700) Pulse Rate:  [63-78] 66 (02/04 0700) Resp:  [16-18] 16 (02/04 0700) BP: (102-133)/(53-64) 111/53 mmHg (02/04 0700) SpO2:  [96 %-99 %] 99 % (02/04 0700) Weight:  [134 lb 7.7 oz (61 kg)] 134 lb 7.7 oz (61 kg) (02/04 0516) Last BM Date: 01/15/15  Intake/Output from previous day: 02/03 0701 - 02/04 0700 In: 2165 [P.O.:720; I.V.:1445] Out: 200 [Urine:200] Intake/Output this shift: Total I/O In: 120 [P.O.:120] Out: -   Medications Current Facility-Administered Medications  Medication Dose Route Frequency Provider Last Rate Last Dose  . 0.9 %  sodium chloride infusion   Intravenous Continuous Jerald KiefStephen K Chiu, MD 75 mL/hr at 01/15/15 2104    . acetaminophen (TYLENOL) tablet 650 mg  650 mg Oral Q6H PRN Maretta BeesShanker M Ghimire, MD       Or  . acetaminophen (TYLENOL) suppository 650 mg  650 mg Rectal Q6H PRN Shanker Levora DredgeM Ghimire, MD      . albuterol (PROVENTIL) (2.5 MG/3ML) 0.083% nebulizer solution 2.5 mg  2.5 mg Nebulization Q2H PRN Maretta BeesShanker M Ghimire, MD      . aspirin EC tablet 325 mg  325 mg Oral Daily Maretta BeesShanker M Ghimire, MD   325 mg at 01/15/15 1019  . atorvastatin (LIPITOR) tablet 20 mg  20 mg Oral q1800 Maretta BeesShanker M Ghimire, MD   20 mg at 01/15/15 2242  . carvedilol (COREG) tablet 6.25 mg  6.25 mg Oral BID WC Maretta BeesShanker M Ghimire, MD   6.25 mg at 01/15/15 [id number]  . guaiFENesin-dextromethorphan (ROBITUSSIN DM) 100-10 MG/5ML syrup 5 mL  5 mL Oral Q4H PRN Maretta BeesShanker M Ghimire, MD      . heparin injection 5,000 Units  5,000  Units Subcutaneous 3 times per day Maretta BeesShanker M Ghimire, MD   5,000 Units at 01/16/15 0545  . insulin aspart (novoLOG) injection 0-15 Units  0-15 Units Subcutaneous TID WC Jerald KiefStephen K Chiu, MD      . insulin aspart (novoLOG) injection 0-5 Units  0-5 Units Subcutaneous QHS Jerald KiefStephen K Chiu, MD   0 Units at 01/15/15 2242  . isosorbide mononitrate (IMDUR) 24 hr tablet 30 mg  30 mg Oral Daily Maretta BeesShanker M Ghimire, MD   30 mg at 01/15/15 1019  . morphine 2 MG/ML injection 1 mg  1 mg Intravenous Q4H PRN Maretta BeesShanker M Ghimire, MD   1 mg at 01/14/15 1457  . ondansetron (ZOFRAN) tablet 4 mg  4 mg Oral Q6H PRN Shanker Levora DredgeM Ghimire, MD       Or  . ondansetron [facility](ZOFRAN) injection 4 mg  4 mg Intravenous Q6H PRN Shanker Levora DredgeM Ghimire, MD      . oxyCODONE (Oxy IR/ROXICODONE) immediate release tablet 5 mg  5 mg Oral Q4H PRN Maretta BeesShanker M Ghimire, MD   5 mg at 01/15/15 2358  . saccharomyces boulardii (FLORASTOR) capsule 250 mg  250 mg Oral BID Maretta BeesShanker M Ghimire, MD   250 mg at 01/15/15 2242  . sodium chloride 0.9 % injection 3 mL  3 mL Intravenous Q12H Shanker M  Ghimire, MD   3 mL at 01/15/15 1019    PE:  General appearance: alert, cooperative and no distress Neck: no carotid bruit and no JVD Lungs: clear to auscultation bilaterally Heart: regular rate and rhythm, S1, S2 normal, no murmur, click, rub or gallop Extremities: no LEE Pulses: 2+ and symmetric Skin: warm and dry Neurologic: Grossly normal  Lab Results:   Recent Labs  01/14/15 1257 01/14/15 1310 01/14/15 1600 01/15/15 0310  WBC 5.2  --  5.1 4.9  HGB 14.5 17.0 15.4 12.2*  HCT 43.7 50.0 45.9 36.9*  PLT 206  --  194 177   BMET  Recent Labs  01/14/15 1257 01/14/15 1310 01/14/15 1600 01/15/15 0310 01/16/15 0426  NA 136 136  --  135 138  K 4.9 4.8  --  4.6 4.6  CL 98 101  --  102 109  CO2 24  --   --  23 20  GLUCOSE 156* 153*  --  100* 86  BUN 52* 52*  --  65* 56*  CREATININE 3.48* 3.10* 3.46* 3.51* 2.16*  CALCIUM 9.5  --   --  8.4 8.5    PT/INR  Recent Labs  01/14/15 1257  LABPROT 14.1  INR 1.08   Cholesterol No results for input(s): CHOL in the last 72 hours. Cardiac Panel (last 3 results)  Recent Labs  01/14/15 1600 01/14/15 2110 01/15/15 0310  TROPONINI 0.07* 0.09* 0.07*    Studies/Results: 2D echo 01/15/15 Study Conclusions  - Left ventricle: The cavity size was normal. Wall thickness was increased in a pattern of mild LVH. The estimated ejection fraction was 45%. Inferolateral hypokinesis. Basal inferior akinesis. Septal-lateral dyssynchrony. Doppler parameters are consistent with abnormal left ventricular relaxation (grade 1 diastolic dysfunction). - Aortic valve: There was no stenosis. - Mitral valve: Mildly calcified annulus. Mildly calcified leaflets . There was trivial regurgitation. - Right ventricle: The cavity size was normal. Systolic function was normal. - Pulmonary arteries: No complete TR doppler jet so unable to estimate PA systolic pressure. - Inferior vena cava: The vessel was normal in size. The respirophasic diameter changes were in the normal range (>= 50%), consistent with normal central venous pressure.  Impressions:  - Normal LV size with mild LV hypertrophy. EF 45% with inferolateral hypokinesis and basal inferior akinesis. Septal-lateral dyssynchrony. Normal RV size and systolic function. No significant valvular abnormalities.   Assessment/Plan  Principal Problem:   Unstable angina Active Problems:   Dyslipidemia   CORONARY ATHEROSCLEROSIS NATIVE CORONARY ARTERY   Cardiomyopathy, ischemic   Chronic systolic heart failure   Essential hypertension, benign   Diabetes mellitus   ARF (acute renal failure)   Pain in the chest   Nausea  1. [place]/Elevated troponin/CAD: Now CP free. No dyspnea. Troponins minimally elevated with peak of 0.09.Cath films from 2009 reviewed and notable for critical LM, LAD, and LCX stenoses with CTO of the RCA.  Patient's wishes are to avoid CABG or additional caths/interventions. Will continue with conservative approach with medical management: Continue ASA, statin, BB and nitrate.   2. Chronic Systolic CHF/Ischemic cardiomyopathy: 2D echo yesterday demonstrated EF of 45% with mild LVH. Also inferolateral hypokinesis and basal inferior akinesis. Septal-lateral dyssynchrony. Normal RV size and systolic function. No significant valvular abnormalities. Compared to prior echo in 2013, EF is actually improved. EF in 2013 was 35-40%. He is euvolemic on physical exam. No edema. Continue BB and Imdur. No ACE/ARB given AKI. Can consider adding hydralazine if BP further increases.   3. AKI: Scr  remains elevated but significantly improved since admit from 3.51-->2.16 today. He is not on any reno toxic meds. Will continue to monitor.    4. Abdominal Pain: Associated w/ n/v/d along with dark stools and emesis when admitted. Alsosudden drop in H/H yesterday to 12.2./36.9. H/H on admit was 17/50. Pt now denies any further melena. Had a small BM this am and noted it was normal. Will recheck CBC to reassess H/H and platelets.    5. HTN: stable at 111/53. Continue BB,.  6. HLD: continue statin therapy with Lipitor. He is on a low dose. Recommend checking a fasting lipid in the am to assess how well levels are controlled.       LOS: 2 days    Brittainy M. Delmer Islam 01/16/2015 8:50 AM   Patient seen and examined. Agree with assessment and plan. No recurrent chest pain. Echo reveals EF 45% with inferolateral hypokinesis and basal inferior akinesis which is c/w prior CTO of RCA.  Renal fxn improving with Cr 3.5 to 2.16. Medical therapy. F/U CBC today and renal fxn tomorrow.   Lennette Bihari, MD, [facility] 01/16/2015 9:31 AM

## 2015-01-17 DIAGNOSIS — N289 Disorder of kidney and ureter, unspecified: Secondary | ICD-10-CM | POA: Insufficient documentation

## 2015-01-17 LAB — GLUCOSE, CAPILLARY
GLUCOSE-CAPILLARY: 90 mg/dL (ref 70–99)
Glucose-Capillary: 124 mg/dL — ABNORMAL HIGH (ref 70–99)

## 2015-01-17 LAB — LIPID PANEL
Cholesterol: 91 mg/dL (ref 0–200)
HDL: 19 mg/dL — AB (ref >39)
LDL CALC: 45 mg/dL (ref 0–99)
TRIGLYCERIDES: 137 mg/dL (ref <150)
Total CHOL/HDL Ratio: 4.8 RATIO
VLDL: 27 mg/dL (ref 0–40)

## 2015-01-17 LAB — BASIC METABOLIC PANEL
Anion gap: 3 — ABNORMAL LOW (ref 5–15)
BUN: 35 mg/dL — AB (ref 6–23)
CALCIUM: 8.4 mg/dL (ref 8.4–10.5)
CHLORIDE: 111 mmol/L (ref 96–112)
CO2: 24 mmol/L (ref 19–32)
Creatinine, Ser: 1.46 mg/dL — ABNORMAL HIGH (ref 0.50–1.35)
GFR calc Af Amer: 50 mL/min — ABNORMAL LOW (ref >90)
GFR, EST NON AFRICAN AMERICAN: 43 mL/min — AB (ref >90)
Glucose, Bld: 95 mg/dL (ref 70–99)
Potassium: 4.5 mmol/L (ref 3.5–5.1)
SODIUM: 138 mmol/L (ref 135–145)

## 2015-01-17 MED ORDER — SACCHAROMYCES BOULARDII 250 MG PO CAPS: 250.0000 mg | ORAL_CAPSULE | Freq: Two times a day (BID) | ORAL | Status: AC

## 2015-01-17 MED ORDER — CARVEDILOL 6.25 MG PO TABS
6.2500 mg | ORAL_TABLET | Freq: Two times a day (BID) | ORAL | Status: DC
  Administered 2015-01-17: 6.25 mg via ORAL
  Filled 2015-01-17 (×3): qty 1

## 2015-01-17 NOTE — Progress Notes (Signed)
1630 Dishcarged with family. Transported out via  wheelchair by NT

## 2015-01-17 NOTE — Evaluation (Signed)
Physical Therapy Evaluation Patient Details Name: Maurice Jennings MRN: [id number] DOB: 05-Sep-1930 Today's Date: 01/17/2015   History of Present Illness  Pt adm with n/v and  unstable angina. Pt also with acute renal failure. PMH - CHF, CAD, HTN  Clinical Impression  Pt doing well with mobility and no further PT needed.  Ready for dc from PT standpoint.      Follow Up Recommendations No PT follow up    Equipment Recommendations  None recommended by PT    Recommendations for Other Services       Precautions / Restrictions Precautions Precautions: None      Mobility  Bed Mobility Overal bed mobility: Independent                Transfers Overall transfer level: Independent                  Ambulation/Gait Ambulation/Gait assistance: Modified independent (Device/Increase time);Independent Ambulation Distance (Feet): 200 Feet Assistive device: None (or pushing IV pole) Gait Pattern/deviations: WFL(Within Functional Limits)   Gait velocity interpretation: at or above normal speed for age/gender    Stairs            Wheelchair Mobility    Modified Rankin (Stroke Patients Only)       Balance Overall balance assessment: No apparent balance deficits (not formally assessed)                                           Pertinent Vitals/Pain Pain Assessment: No/denies pain    Home Living Family/patient expects to be discharged to:: Private residence Living Arrangements: Alone Available Help at Discharge: Personal care attendant Type of Home: House Home Access: Stairs to enter   Entergy CorporationEntrance Stairs-Number of Steps: 2 Home Layout: One level Home Equipment: Environmental consultantWalker - 2 wheels;Cane - single point      Prior Function Level of Independence: Independent               Hand Dominance        Extremity/Trunk Assessment   Upper Extremity Assessment: Overall WFL for tasks assessed           Lower Extremity Assessment:  Overall WFL for tasks assessed         Communication   Communication: No difficulties  Cognition Arousal/Alertness: Awake/alert Behavior During Therapy: WFL for tasks assessed/performed Overall Cognitive Status: Within Functional Limits for tasks assessed                      General Comments      Exercises        Assessment/Plan    PT Assessment Patent does not need any further PT services  PT Diagnosis Difficulty walking   PT Problem List    PT Treatment Interventions     PT Goals (Current goals can be found in the Care Plan section) Acute Rehab PT Goals PT Goal Formulation: All assessment and education complete, DC therapy    Frequency     Barriers to discharge        Co-evaluation               End of Session   Activity Tolerance: Patient tolerated treatment well Patient left: in bed;with call bell/phone within reach           Time: [id number] PT Time Calculation (min) (ACUTE ONLY): 9 min  Charges:   PT Evaluation $Initial PT Evaluation Tier I: 1 Procedure     PT G Codes:        Katoya Amato 01/25/15, 9:51 AM  [facility] PT [phone]

## 2015-01-17 NOTE — Discharge Summary (Signed)
Physician Discharge Summary  Maurice Jennings AVW:[id number] DOB: [birth year] DOA: 01/14/2015  PCP: Toma Deiters, MD  Admit date: 01/14/2015 Discharge date: 01/17/2015  Time spent: 25 minutes  Recommendations for Outpatient Follow-up:  1. Follow up with PCP in 1-2 weeks 2. Follow up with Cardiology as scheduled  Discharge Diagnoses:  Principal Problem:   Unstable angina Active Problems:   Dyslipidemia   CORONARY ATHEROSCLEROSIS NATIVE CORONARY ARTERY   Cardiomyopathy, ischemic   Chronic systolic heart failure   Essential hypertension, benign   Diabetes mellitus   ARF (acute renal failure)   Pain in the chest   Nausea   Other noninfectious gastroenteritis   Discharge Condition: Improved  Diet recommendation: Heart healthy  Filed Weights   01/15/15 0442 01/16/15 0516 01/17/15 0545  Weight: 58.931 kg (129 lb 14.7 oz) 61 kg (134 lb 7.7 oz) 62.506 kg (137 lb 12.8 oz)    History of present illness:  Please see admit h and p from 2/2 for details. Briefly, pt presented with intractable n/v/d with chest pains. Symptoms were proceeded by consuming home-made deer soup. The patient was admitted for further work up.  Hospital Course:  1. ARF 1. Cr had initially worsened, but has improved nicely with hydration 2. Continued to avoid nephrotoxic agents while inpatient 2. N/V 1. Suspect infectious gastroenteritis as pt reports sx started shortly after consuming home-made soup made with deer meat on the same day as onset 2. Improving 3. Patient was continued with gentle IVF while inpatient 3. Essential HTN 1. BP remained stable 4. HLD 1. Continued statin 2. AM lipids ordered per Cardiology 5. Chronic systolic CHF 1. Ef of 45% 2. IVF given cautiously per above 3. Remained euvolemic 6. Chest pain 1. Cardiology was consulted 2. Pt has declined aggressive cardiac w/u 3. Recommendations to continue medical mgt for now 7. DM2 1. Glucose was initially elevated, improved with addition  of SSI coverage 8. DVT prophylaxis 1. Heparin subQ while inpatient  Consultations:  Cardiology  Discharge Exam: Filed Vitals:   01/16/15 0900 01/16/15 1357 01/16/15 2143 01/17/15 0545  BP: 107/54 101/53 120/62 134/64  Pulse: 63 64 65 58  Temp:  97.6 F (36.4 C) 98.1 F (36.7 C) 98.5 F (36.9 C)  TempSrc:  Oral Oral Oral  Resp: Height:      Weight:    62.506 kg (137 lb 12.8 oz)  SpO2: 98% 96% 95% 98%    General: Awake, in nad Cardiovascular: regular, s1, s2 Respiratory: normal resp effort, no wheezing  Discharge Instructions     Medication List    STOP taking these medications        amLODipine 5 MG tablet  Commonly known as:  NORVASC     isosorbide mononitrate 120 MG 24 hr tablet  Commonly known as:  IMDUR      TAKE these medications        albuterol (2.5 MG/3ML) 0.083% nebulizer solution  Commonly known as:  PROVENTIL  Take 2.5 mg by nebulization 4 (four) times daily.     albuterol 108 (90 BASE) MCG/ACT inhaler  Commonly known as:  PROVENTIL HFA;VENTOLIN HFA  Inhale 2 puffs into the lungs 4 (four) times daily.     allopurinol 300 MG tablet  Commonly known as:  ZYLOPRIM  Take 300 mg by mouth daily.     ALPRAZolam 0.5 MG tablet  Commonly known as:  XANAX  Take 0.5 mg by mouth 2 (two) times daily.     aspirin  81 MG tablet  Take 162 mg by mouth daily.     atorvastatin 20 MG tablet  Commonly known as:  LIPITOR  Take 20 mg by mouth daily at 6 PM.     carvedilol 12.5 MG tablet  Commonly known as:  COREG  TAKE 1 AND 1/2 TABLETS TWICE DAILY     clopidogrel 75 MG tablet  Commonly known as:  PLAVIX  Take 75 mg by mouth daily.     furosemide 40 MG tablet  Commonly known as:  LASIX  Take 40 mg by mouth daily.     gabapentin 100 MG capsule  Commonly known as:  NEURONTIN  Take 100 mg by mouth 3 (three) times daily.     HYDROcodone-acetaminophen 5-325 MG per tablet  Commonly known as:  NORCO/VICODIN  Take 1 tablet by mouth every 6  (six) hours as needed for pain.     loperamide 2 MG capsule  Commonly known as:  IMODIUM  Take 2 mg by mouth as needed for diarrhea or loose stools.     nitroGLYCERIN 0.4 MG SL tablet  Commonly known as:  NITROSTAT  Place 1 tablet (0.4 mg total) under the tongue every 5 (five) minutes as needed for chest pain.     ranitidine 150 MG tablet  Commonly known as:  ZANTAC  Take 150 mg by mouth 2 (two) times daily.     saccharomyces boulardii 250 MG capsule  Commonly known as:  FLORASTOR  Take 1 capsule (250 mg total) by mouth 2 (two) times daily.       Allergies  Allergen Reactions  . Penicillins Swelling    Allergic to injection pcn Patient said he can take the pills   Follow-up Information    Follow up with Laqueta LindenKONESWARAN, SURESH A, MD On 01/27/2015.   Specialty:  Cardiology   Why:  at 2:20 pm  new address- across from [address]   Contact information:   [address] [place] [address] [phone]        The results of significant diagnostics from this hospitalization (including imaging, microbiology, ancillary and laboratory) are listed below for reference.    Significant Diagnostic Studies: Dg Chest Portable 1 View  01/14/2015   CLINICAL DATA:  Chest pain.  Initial encounter.  CHF.  Nonsmoker.  EXAM: PORTABLE CHEST - 1 VIEW  COMPARISON:  10/02/2011.  FINDINGS: Cardiopericardial silhouette within normal limits for projection. Mediastinal contours normal. Trachea midline. No airspace disease or effusion. Monitoring leads project over the chest.  IMPRESSION: No active disease.   Electronically Signed   By: Andreas NewportGeoffrey  Lamke M.D.   On: 01/14/2015 13:31   Dg Abd Portable 1v  01/15/2015   CLINICAL DATA:  Abdominal pain.  EXAM: PORTABLE ABDOMEN - 1 VIEW  COMPARISON:  Scout image for CT scan dated 02/09/2010  FINDINGS: There are no dilated loops of large or small bowel. There is air scattered throughout the nondistended colon. Moderate air in the stomach.  No  visible free air or free fluid on the supine radiograph. No acute osseous abnormality.  IMPRESSION: Benign appearing abdomen.   Electronically Signed   By: Geanie CooleyJim  Maxwell M.D.   On: 01/15/2015 13:30    Microbiology: Recent Results (from the past 240 hour(s))  Clostridium Difficile by PCR     Status: None   Collection Time: 01/15/15  3:58 PM  Result Value Ref Range Status   C difficile by pcr NEGATIVE NEGATIVE Final  Labs: Basic Metabolic Panel:  Recent Labs Lab 01/14/15 1257 01/14/15 1310 01/14/15 1600 01/15/15 0310 01/16/15 0426 01/17/15 0406  NA 136 136  --  135 138 138  K 4.9 4.8  --  4.6 4.6 4.5  CL 98 101  --  102 109 111  CO2 24  --   --  GLUCOSE 156* 153*  --  100* 86 95  BUN 52* 52*  --  65* 56* 35*  CREATININE 3.48* 3.10* 3.46* 3.51* 2.16* 1.46*  CALCIUM 9.5  --   --  8.4 8.5 8.4  MG 1.9  --   --   --   --   --    Liver Function Tests:  Recent Labs Lab 01/14/15 1257  AST 19  ALT 11  ALKPHOS 86  BILITOT 0.8  PROT 7.8  ALBUMIN 4.5   No results for input(s): LIPASE, AMYLASE in the last 168 hours. No results for input(s): AMMONIA in the last 168 hours. CBC:  Recent Labs Lab 01/14/15 1257 01/14/15 1310 01/14/15 1600 01/15/15 0310 01/16/15 0426  WBC 5.2  --  5.1 4.9 5.6  NEUTROABS 2.6  --   --   --   --   HGB 14.5 17.0 15.4 12.2* 11.0*  HCT 43.7 50.0 45.9 36.9* 34.0*  MCV 90.1  --  92.2 90.2 92.6  PLT 206  --  194 177 173   Cardiac Enzymes:  Recent Labs Lab 01/14/15 1257 01/14/15 1600 01/14/15 2110 01/15/15 0310  TROPONINI 0.07* 0.07* 0.09* 0.07*   BNP: BNP (last 3 results) No results for input(s): BNP in the last 8760 hours.  ProBNP (last 3 results) No results for input(s): PROBNP in the last 8760 hours.  CBG:  Recent Labs Lab 01/16/15 0545 01/16/15 1255 01/16/15 1613 01/16/15 2141 01/17/15 0634  GLUCAP 98 100* 84 89 90    Signed:  Christpher Stogsdill K  [facility] 01/17/2015, 11:06 AM

## 2015-01-17 NOTE — Progress Notes (Signed)
Subjective: No chest pain, no SOB  Objective: Vital signs in last 24 hours: Temp:  [97.6 F (36.4 C)-98.5 F (36.9 C)] 98.5 F (36.9 C) (02/05 0545) Pulse Rate:  [58-65] 58 (02/05 0545) Resp:  [17-18] 18 (02/05 0545) BP: (101-134)/(53-64) 134/64 mmHg (02/05 0545) SpO2:  [95 %-98 %] 98 % (02/05 0545) Weight:  [137 lb 12.8 oz (62.506 kg)] 137 lb 12.8 oz (62.506 kg) (02/05 0545) Weight change: 3 lb 5.1 oz (1.506 kg) Last BM Date: 01/15/15 Intake/Output from previous day: -624   02/04 0701 - 02/05 0700 In: 1682.3 [P.O.:979; I.V.:703.3] Out: 2325 [Urine:2325] Intake/Output this shift: Total I/O In: 450 [P.O.:450] Out: -   PE: General:Pleasant affect, NAD, up shaving at the sink Skin:Warm and dry, brisk capillary refill HEENT:normocephalic, sclera clear, mucus membranes moist Neck:supple, no JVD  Heart:S1S2 RRR without murmur, gallup, rub or click Lungs:clear without rales, rhonchi, or wheezes ZOX:WRUE, non tender, + BS, do not palpate liver spleen or masses Ext:no lower ext edema, 2+ pedal pulses, 2+ radial pulses Neuro:alert and oriented X 3, MAE, follows commands, + facial symmetry Tele:  No tele    Lab Results:  Recent Labs  01/15/15 0310 01/16/15 0426  WBC 4.9 5.6  HGB 12.2* 11.0*  HCT 36.9* 34.0*  PLT 177 173   BMET  Recent Labs  01/16/15 0426 01/17/15 0406  NA 138 138  K 4.6 4.5  CL 109 111  CO2 20 24  GLUCOSE 86 95  BUN 56* 35*  CREATININE 2.16* 1.46*  CALCIUM 8.5 8.4    Recent Labs  01/14/15 2110 01/15/15 0310  TROPONINI 0.09* 0.07*    Lab Results  Component Value Date   CHOL 91 01/17/2015   HDL 19* 01/17/2015   LDLCALC 45 01/17/2015   TRIG 137 01/17/2015   CHOLHDL 4.8 01/17/2015   No results found for: HGBA1C   No results found for: TSH  Hepatic Function Panel  Recent Labs  01/14/15 1257  PROT 7.8  ALBUMIN 4.5  AST 19  ALT 11  ALKPHOS 86  BILITOT 0.8    Recent Labs  01/17/15 0350  CHOL 91   No  results for input(s): PROTIME in the last 72 hours.     Studies/Results: Dg Abd Portable 1v  01/15/2015   CLINICAL DATA:  Abdominal pain.  EXAM: PORTABLE ABDOMEN - 1 VIEW  COMPARISON:  [facility] image for CT scan dated 02/09/2010  FINDINGS: There are no dilated loops of large or small bowel. There is air scattered throughout the nondistended colon. Moderate air in the stomach.  No visible free air or free fluid on the supine radiograph. No acute osseous abnormality.  IMPRESSION: Benign appearing abdomen.   Electronically Signed   By: Geanie Cooley M.D.   On: 01/15/2015 13:30   ECHO: Left ventricle: The cavity size was normal. Wall thickness was increased in a pattern of mild LVH. The estimated ejection fraction was 45%. Inferolateral hypokinesis. Basal inferior akinesis. Septal-lateral dyssynchrony. Doppler parameters are consistent with abnormal left ventricular relaxation (grade 1 diastolic dysfunction). - Aortic valve: There was no stenosis. - Mitral valve: Mildly calcified annulus. Mildly calcified leaflets . There was trivial regurgitation. - Right ventricle: The cavity size was normal. Systolic function was normal. - Pulmonary arteries: No complete TR doppler jet so unable to estimate PA systolic pressure. - Inferior vena cava: The vessel was normal in size. The respirophasic diameter changes were in the normal range (>= 50%), consistent with normal  central venous pressure. Impressions: - Normal LV size with mild LV hypertrophy. EF 45% with inferolateral hypokinesis and basal inferior akinesis. Septal-lateral dyssynchrony. Normal RV size and systolic function. No significant valvular abnormalities.  Medications: I have reviewed the patient's current medications. Scheduled Meds: . aspirin EC  325 mg Oral Daily  . atorvastatin  20 mg Oral q1800  . carvedilol  6.25 mg Oral BID WC  . insulin aspart  0-15 Units Subcutaneous TID WC  . insulin aspart  0-5  Units Subcutaneous QHS  . isosorbide mononitrate  30 mg Oral Daily  . saccharomyces boulardii  250 mg Oral BID  . sodium chloride  3 mL Intravenous Q12H   Continuous Infusions: . sodium chloride 50 mL/hr at 01/16/15 1036   PRN Meds:.acetaminophen **OR** acetaminophen, albuterol, guaiFENesin-dextromethorphan, morphine injection, ondansetron **OR** ondansetron (ZOFRAN) IV, oxyCODONE  Assessment/Plan:  81 y.o. male with a PMHx of severe CAD , [facility] for evaluation of prolonged chest discomfort accompanied by nausea and vomiting and diarrhea after eating bad dear. Ruled in with elevated troponin.     1. [place]/Elevated troponin/CAD: Now CP free. No dyspnea. Troponins minimally elevated with peak of 0.09.Cath films from 2009 reviewed and notable for critical LM, LAD, and LCX stenoses with CTO of the RCA. Patient's wishes are to avoid CABG or additional caths/interventions. Will continue with conservative approach with medical management: Continue ASA, statin, BB and nitrate. [place] he has seen Dr. Darl HouseholderKoneswaren in the past.  HIS PCP is Dr. Horris LatinoHasanaji  2. Chronic Systolic CHF/Ischemic cardiomyopathy: 2D echo yesterday demonstrated EF of 45% with mild LVH. Also inferolateral hypokinesis and basal inferior akinesis. Septal-lateral dyssynchrony. Normal RV size and systolic function. No significant valvular abnormalities. Compared to prior echo in 2013, EF is actually improved. EF in 2013 was 35-40%. He is euvolemic on physical exam. No edema. Continue BB and Imdur. No ACE/ARB given AKI. Can consider adding hydralazine if BP further increases.   3. AKI: Scr remains elevated but significantly improved since admit from 3.51-->2.16 -->1.46 today. He is not on any reno toxic meds. Will continue to monitor.    4. Abdominal Pain: Associated w/ n/v/d along with dark stools and emesis when admitted. Alsosudden drop in H/H yesterday to 12.2./36.9. H/H on admit was 17/50. Pt now  denies any further melena. Had a small BM this am and noted it was normal. Will recheck CBC to reassess H/H and platelets. Stable H/H 11/34, may have been dehydrated on admit as well.   5. HTN: stable at 134/64. Continue BB,.  6. HLD: continue statin therapy with Lipitor. He is on a low dose. Fasting lipid in the am to assess how well levels are controlled. LDL of 45, HDL 19 this is with recent dehydration and N,V,D     LOS: 3 days   Time spent with pt. :15 minutes. [facility] HospitalNGOLD,LAURA R  Nurse Practitioner Certified Pager [phone] or after 5pm and on weekends call [phone] 01/17/2015, 10:12 AM   Patient seen and examined. Agree with assessment and plan. Renal fxn continue to improve. No recurrent symptoms. For dc today with f/[place] in [place] with Dr. Percell BeltKonenswaren.   Lennette Biharihomas A. Kelly, MD, [facility] Center[facility] 01/17/2015 11:13 AM

## 2015-01-18 LAB — GI PATHOGEN PANEL BY PCR, STOOL
C difficile toxin A/B: NOT DETECTED
CAMPYLOBACTER BY PCR: NOT DETECTED
Cryptosporidium by PCR: NOT DETECTED
E COLI (ETEC) LT/ST: NOT DETECTED
E COLI 0157 BY PCR: NOT DETECTED
E coli (STEC): NOT DETECTED
G lamblia by PCR: NOT DETECTED
Norovirus GI/GII: NOT DETECTED
Rotavirus A by PCR: NOT DETECTED
Salmonella by PCR: NOT DETECTED
Shigella by PCR: NOT DETECTED

## 2015-01-27 ENCOUNTER — Encounter: Payer: Medicare Other | Admitting: Cardiovascular Disease

## 2016-05-30 IMAGING — CR DG CHEST 1V PORT
1 series · 1 of 1 positions shown · non-contrast
Comparison: 10/02/2011.

CLINICAL DATA: Chest pain.  Initial encounter.  CHF.  Nonsmoker.

EXAM:
PORTABLE CHEST - 1 VIEW

[AP]
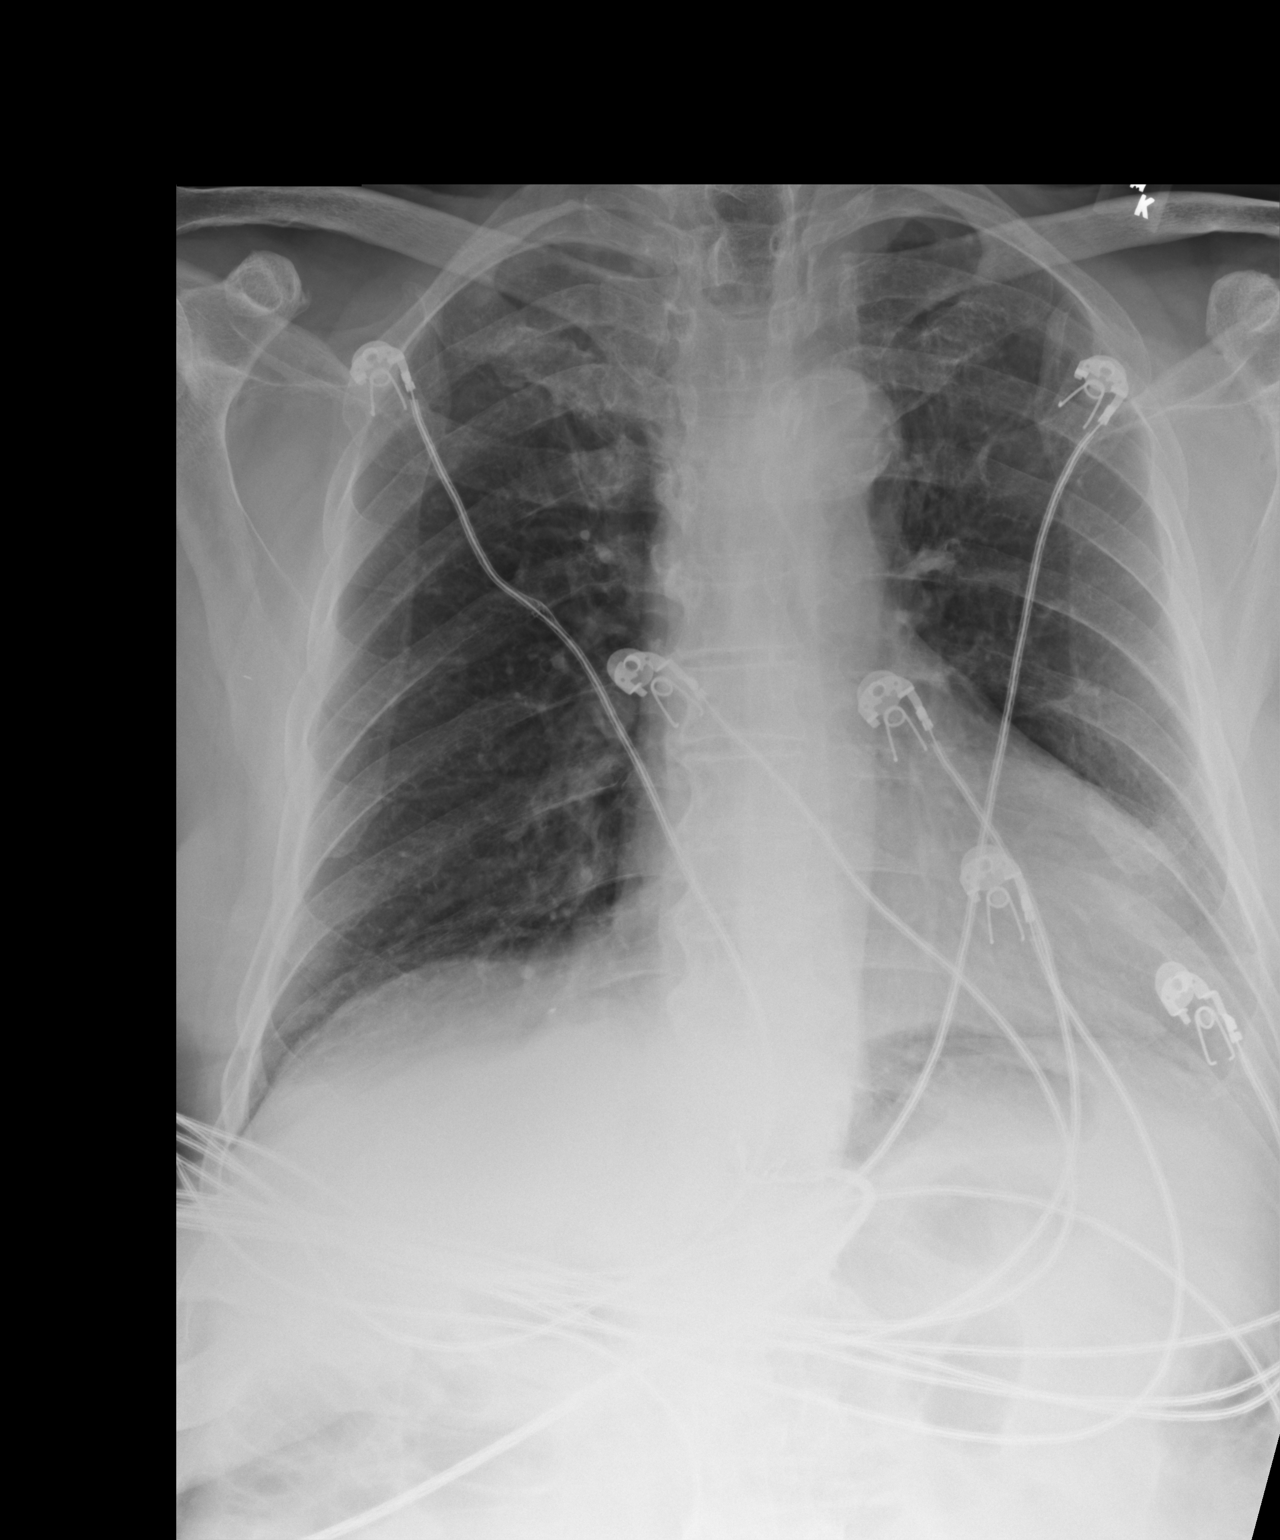

[1 of 1 positions shown; findings below may reference images not displayed]

FINDINGS: Cardiopericardial silhouette within normal limits for projection.
Mediastinal contours normal. Trachea midline. No airspace disease or
effusion. Monitoring leads project over the chest.
IMPRESSION: No active disease.

## 2016-05-31 IMAGING — CR DG ABD PORTABLE 1V
1 series · 1 of 1 positions shown · non-contrast
Comparison: Scout image for CT scan dated 02/09/2010

CLINICAL DATA: Abdominal pain.

EXAM:
PORTABLE ABDOMEN - 1 VIEW

[AP]
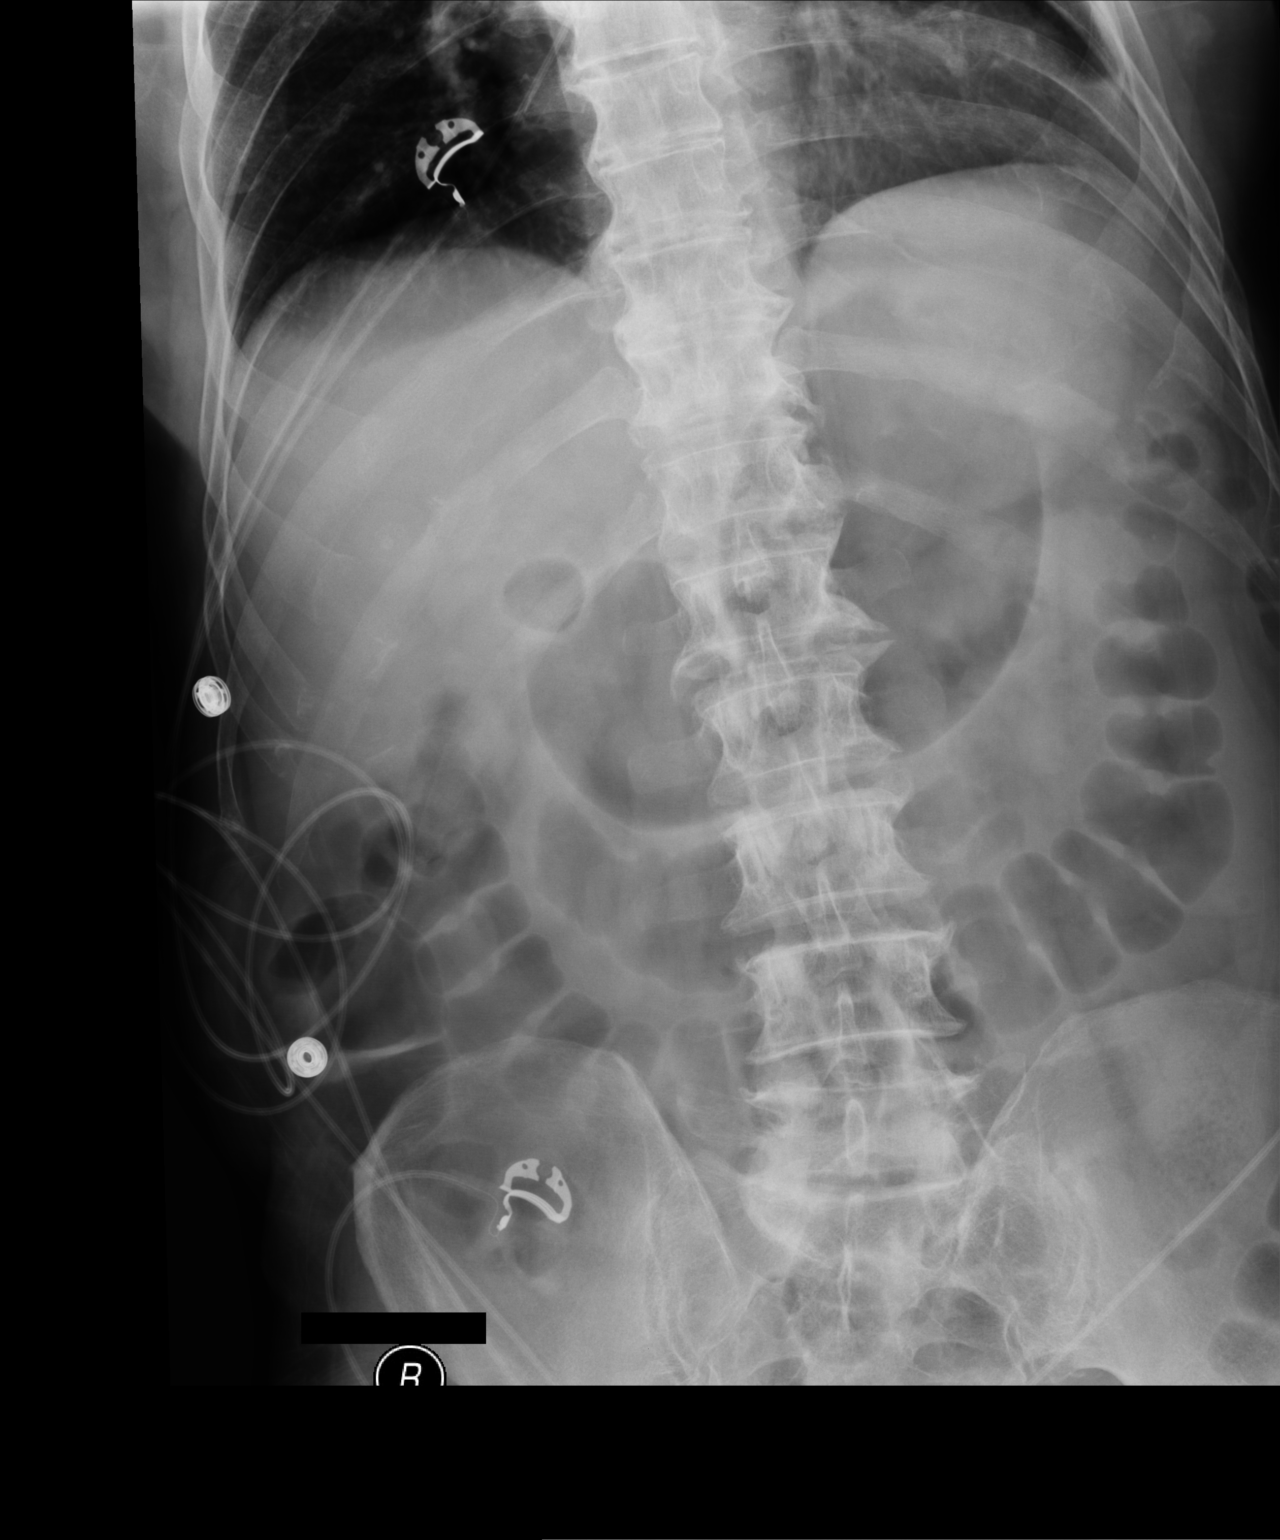

[1 of 1 positions shown; findings below may reference images not displayed]

FINDINGS: There are no dilated loops of large or small bowel. There is air
scattered throughout the nondistended colon. Moderate air in the
stomach.

No visible free air or free fluid on the supine radiograph. No acute
osseous abnormality.
IMPRESSION: Benign appearing abdomen.

## 2019-03-14 DEATH — deceased
